# Patient Record
Sex: Male | Born: 1985 | Hispanic: No | Marital: Married | Smoking: Former smoker
Health system: Southern US, Community
[De-identification: ages and names within clinical notes are randomized; demographics above are authoritative.]

## PROBLEM LIST (undated history)

## (undated) DIAGNOSIS — L932 Other local lupus erythematosus: Secondary | ICD-10-CM

## (undated) DIAGNOSIS — J45909 Unspecified asthma, uncomplicated: Secondary | ICD-10-CM

## (undated) DIAGNOSIS — E785 Hyperlipidemia, unspecified: Secondary | ICD-10-CM

## (undated) DIAGNOSIS — E039 Hypothyroidism, unspecified: Secondary | ICD-10-CM

## (undated) HISTORY — DX: Hyperlipidemia, unspecified: E78.5

## (undated) HISTORY — DX: Other local lupus erythematosus: L93.2

## (undated) HISTORY — DX: Unspecified asthma, uncomplicated: J45.909

## (undated) HISTORY — DX: Hypothyroidism, unspecified: E03.9

---

## 2015-04-02 ENCOUNTER — Encounter: Payer: Self-pay | Admitting: Family Medicine

## 2015-04-02 ENCOUNTER — Ambulatory Visit (INDEPENDENT_AMBULATORY_CARE_PROVIDER_SITE_OTHER): Payer: BLUE CROSS/BLUE SHIELD | Admitting: Family Medicine

## 2015-04-02 VITALS — BP 131/85 | HR 86 | Ht 69.0 in | Wt 181.0 lb

## 2015-04-02 DIAGNOSIS — IMO0001 Reserved for inherently not codable concepts without codable children: Secondary | ICD-10-CM | POA: Insufficient documentation

## 2015-04-02 DIAGNOSIS — E039 Hypothyroidism, unspecified: Secondary | ICD-10-CM | POA: Insufficient documentation

## 2015-04-02 DIAGNOSIS — J4520 Mild intermittent asthma, uncomplicated: Secondary | ICD-10-CM | POA: Diagnosis not present

## 2015-04-02 DIAGNOSIS — E038 Other specified hypothyroidism: Secondary | ICD-10-CM | POA: Diagnosis not present

## 2015-04-02 DIAGNOSIS — J45909 Unspecified asthma, uncomplicated: Secondary | ICD-10-CM | POA: Insufficient documentation

## 2015-04-02 DIAGNOSIS — Z0000 Encounter for general adult medical examination without abnormal findings: Secondary | ICD-10-CM | POA: Diagnosis not present

## 2015-04-02 HISTORY — DX: Hypothyroidism, unspecified: E03.9

## 2015-04-02 HISTORY — DX: Unspecified asthma, uncomplicated: J45.909

## 2015-04-02 MED ORDER — ALBUTEROL SULFATE HFA 108 (90 BASE) MCG/ACT IN AERS: 2.0000 | INHALATION_SPRAY | Freq: Four times a day (QID) | RESPIRATORY_TRACT | Status: DC | PRN

## 2015-04-02 MED ORDER — LEVOTHYROXINE SODIUM 100 MCG PO TABS: 100.0000 ug | ORAL_TABLET | Freq: Every day | ORAL | Status: DC

## 2015-04-02 NOTE — Progress Notes (Signed)
Austin Miranda is a 29 y.o. male who presents to [facility]  today for establish care and wellness visit. Patient has no active medical problems. He has hypothyroidism and mild asthma. These are well controlled with levothyroxine and intermittent albuterol. He uses as albuterol inhaler a few times a week. This is been extensively worked up with a pulmonologist in the past and this is the best option for him. He denies any chest pains or palpitations shortness of breath. He denies any feeling too hot or feeling to call. He feels well otherwise. He is not currently fasting.   History reviewed. No pertinent past medical history. History reviewed. No pertinent past surgical history. Social History  Substance Use Topics  . Smoking status: Former Games developer  . Smokeless tobacco: Not on file  . Alcohol Use: No   ROS as above Medications: Current Outpatient Prescriptions  Medication Sig Dispense Refill  . albuterol (VENTOLIN HFA) 108 (90 BASE) MCG/ACT inhaler Inhale 2 puffs into the lungs every 6 (six) hours as needed. 1 Inhaler 2  . levothyroxine (SYNTHROID, LEVOTHROID) 100 MCG tablet Take 1 tablet (100 mcg total) by mouth daily before breakfast. 90 tablet 2   No current facility-administered medications for this visit.   No Known Allergies   Exam:  BP 131/85 mmHg  Pulse 86  Ht  (1.753 m)  Wt 181 lb (82.101 kg)  BMI 26.72 kg/m2 Gen: Well NAD HEENT: EOMI,  MMM Lungs: Normal work of breathing. CTABL Heart: RRR no MRG Abd: NABS, Soft. Nondistended, Nontender Exts: Brisk capillary refill, warm and well perfused.   No results found for this or any previous visit (from the past 24 hour(s)). No results found.   Please see individual assessment and plan sections.

## 2015-04-02 NOTE — Patient Instructions (Signed)
Thank you for coming in today. Return for fasting labs.  I will call with results.  Call or go to the emergency room if you get worse, have trouble breathing, have chest pains, or palpitations.

## 2015-04-02 NOTE — Assessment & Plan Note (Signed)
Doing well. Screening fasting labs last few days.

## 2015-04-02 NOTE — Assessment & Plan Note (Signed)
Refill albuterol. Continue to follow.

## 2015-04-02 NOTE — Assessment & Plan Note (Addendum)
Refill levothyroxine. Check TSH

## 2015-04-03 LAB — CBC
HEMATOCRIT: 45.9 % (ref 39.0–52.0)
HEMOGLOBIN: 15.9 g/dL (ref 13.0–17.0)
MCH: 29.6 pg (ref 26.0–34.0)
MCHC: 34.6 g/dL (ref 30.0–36.0)
MCV: 85.3 fL (ref 78.0–100.0)
MPV: 10.2 fL (ref 8.6–12.4)
Platelets: 308 10*3/uL (ref 150–400)
RBC: 5.38 MIL/uL (ref 4.22–5.81)
RDW: 14.6 % (ref 11.5–15.5)
WBC: 5.2 10*3/uL (ref 4.0–10.5)

## 2015-04-03 LAB — LIPID PANEL
Cholesterol: 206 mg/dL — ABNORMAL HIGH (ref 125–200)
HDL: 19 mg/dL — ABNORMAL LOW (ref >=40)
LDL CALC: 122 mg/dL (ref <130)
Total CHOL/HDL Ratio: 10.8 Ratio — ABNORMAL HIGH (ref <=5.0)
Triglycerides: 324 mg/dL — ABNORMAL HIGH (ref <150)
VLDL: 65 mg/dL — AB (ref <30)

## 2015-04-03 LAB — COMPREHENSIVE METABOLIC PANEL
ALK PHOS: 62 U/L (ref 40–115)
ALT: 80 U/L — AB (ref 9–46)
AST: 33 U/L (ref 10–40)
Albumin: 4.4 g/dL (ref 3.6–5.1)
BUN: 9 mg/dL (ref 7–25)
CO2: 25 mmol/L (ref 20–31)
CREATININE: 0.91 mg/dL (ref 0.60–1.35)
Calcium: 9.4 mg/dL (ref 8.6–10.3)
Chloride: 103 mmol/L (ref 98–110)
GLUCOSE: 97 mg/dL (ref 65–99)
Potassium: 4.7 mmol/L (ref 3.5–5.3)
SODIUM: 138 mmol/L (ref 135–146)
TOTAL PROTEIN: 6.8 g/dL (ref 6.1–8.1)
Total Bilirubin: 0.8 mg/dL (ref 0.2–1.2)

## 2015-04-03 LAB — TSH: TSH: 8.594 u[IU]/mL — AB (ref 0.350–4.500)

## 2015-04-03 LAB — T4, FREE: FREE T4: 1.06 ng/dL (ref 0.80–1.80)

## 2015-04-04 MED ORDER — ATORVASTATIN CALCIUM 40 MG PO TABS: 40.0000 mg | ORAL_TABLET | Freq: Every day | ORAL | Status: DC

## 2015-04-04 NOTE — Addendum Note (Signed)
Addended by: Rodolph Bong on: 04/04/2015 01:16 PM   Modules accepted: Orders

## 2015-04-04 NOTE — Progress Notes (Signed)
Quick Note:  Lipid panel really bad. I send in lipitor. Return for recheck in 1 month. ______

## 2015-04-13 ENCOUNTER — Encounter: Payer: Self-pay | Admitting: Family Medicine

## 2015-04-13 DIAGNOSIS — E038 Other specified hypothyroidism: Secondary | ICD-10-CM

## 2015-04-13 DIAGNOSIS — J4520 Mild intermittent asthma, uncomplicated: Secondary | ICD-10-CM

## 2015-04-14 MED ORDER — ATORVASTATIN CALCIUM 40 MG PO TABS: 40.0000 mg | ORAL_TABLET | Freq: Every day | ORAL | Status: DC

## 2015-04-14 MED ORDER — LEVOTHYROXINE SODIUM 100 MCG PO TABS: 100.0000 ug | ORAL_TABLET | Freq: Every day | ORAL | Status: DC

## 2015-04-14 MED ORDER — ALBUTEROL SULFATE HFA 108 (90 BASE) MCG/ACT IN AERS: 2.0000 | INHALATION_SPRAY | Freq: Four times a day (QID) | RESPIRATORY_TRACT | Status: DC | PRN

## 2015-04-14 NOTE — Telephone Encounter (Signed)
[facility]

## 2015-10-20 ENCOUNTER — Encounter: Payer: Self-pay | Admitting: Family Medicine

## 2015-10-20 ENCOUNTER — Ambulatory Visit (INDEPENDENT_AMBULATORY_CARE_PROVIDER_SITE_OTHER): Payer: BLUE CROSS/BLUE SHIELD | Admitting: Family Medicine

## 2015-10-20 VITALS — BP 122/79 | HR 89 | Wt 175.0 lb

## 2015-10-20 DIAGNOSIS — E038 Other specified hypothyroidism: Secondary | ICD-10-CM

## 2015-10-20 DIAGNOSIS — R42 Dizziness and giddiness: Secondary | ICD-10-CM

## 2015-10-20 NOTE — Progress Notes (Signed)
       Austin CarbonDeepak Miranda is a 30 y.o. male who [facility] today for lightheaded and dizziness. Patient was in normal state of health when he became somewhat lightheaded and dizzy today. He denies any vertigo. He was standing when his symptoms started. He felt somewhat better when he sat down. He denies any palpitations chest pain or shortness of breath. He denies any changes in medications or recent exertion or loss of sweating. He denies any change in hearing or tinnitus.   No past medical history on file. No past surgical history on file. Social History  Substance Use Topics  . Smoking status: Former Games developermoker  . Smokeless tobacco: Not on file  . Alcohol Use: No   family history includes Diabetes in his mother; Hypertension in his mother.  ROS as above Medications: Current Outpatient Prescriptions  Medication Sig Dispense Refill  . albuterol (VENTOLIN HFA) 108 (90 BASE) MCG/ACT inhaler Inhale 2 puffs into the lungs every 6 (six) hours as needed. 1 Inhaler 2  . levothyroxine (SYNTHROID, LEVOTHROID) 100 MCG tablet Take 1 tablet (100 mcg total) by mouth daily before breakfast. 90 tablet 2   No current facility-administered medications for this visit.   No Known Allergies   Exam:  BP 122/79 mmHg  Pulse 89  Wt 175 lb (79.379 kg)   Orthostatic VS for the past 24 hrs:  BP- Lying Pulse- Lying BP- Sitting Pulse- Sitting BP- Standing at 0 minutes Pulse- Standing at 0 minutes  10/20/15 1348 129/74 mmHg 78 117/78 mmHg 87 127/83 mmHg 106      Gen: Well NAD HEENT: EOMI,  MMM normal tympanic membranes bilaterally. PERRL bilaterally. Lungs: Normal work of breathing. CTABL Heart: RRR no MRG Abd: NABS, Soft. Nondistended, Nontender Exts: Brisk capillary refill, warm and well perfused.  Neuro: Alert and oriented normal coordination balance gait strength and sensation. Negative  Dix-Hallpike test bilaterally.  No results found for this or any previous visit (from the past 24 hour(s)). No results found.   Please see individual assessment and plan sections.

## 2015-10-20 NOTE — Patient Instructions (Signed)
Thank you for coming in today. Drink plenty of fluids and eat something salty.  Return if not better.  Call or go to the emergency room if you get worse, have trouble breathing, have chest pains, or palpitations.   Orthostatic Hypotension Orthostatic hypotension is a sudden drop in blood pressure. It happens when you quickly stand up from a seated or lying position. You may feel dizzy or light-headed. This can last for just a few seconds or for up to a few minutes. It is usually not a serious problem. However, if this happens frequently or gets worse, it can be a sign of something more serious. CAUSES  Different things can cause orthostatic hypotension, including:   Loss of body fluids (dehydration).  Medicines that lower blood pressure.  Sudden changes in posture, such as standing up quickly after you have been sitting or lying down.  Taking too much of your medicine. SIGNS AND SYMPTOMS   Light-headedness or dizziness.   Fainting or near-fainting.   A fast heart rate.   Weakness.   Feeling tired (fatigue).  DIAGNOSIS  Your health care provider may do several things to help diagnose your condition and identify the cause. These may include:   Taking a medical history and doing a physical exam.  Checking your blood pressure. Your health care provider will check your blood pressure when you are:  Lying down.  Sitting.  Standing.  Using tilt table testing. In this test, you lie down on a table that moves from a lying position to a standing position. You will be strapped onto the table. This test monitors your blood pressure and heart rate when you are in different positions. TREATMENT  Treatment will vary depending on the cause. Possible treatments include:   Changing the dosage of your medicines.  Wearing compression stockings on your lower legs.  Standing up slowly after sitting or lying down.  Eating more salt.  Eating frequent, small meals.  In some cases,  getting IV fluids.  Taking medicine to enhance fluid retention. HOME CARE INSTRUCTIONS  Only take over-the-counter or prescription medicines as directed by your health care provider.  Follow your health care provider's instructions for changing the dosage of your current medicines.  Do not stop or adjust your medicine on your own.  Stand up slowly after sitting or lying down. This allows your body to adjust to the different position.  Wear compression stockings as directed.  Eat extra salt as directed.  Do not add extra salt to your diet unless directed to by your health care provider.  Eat frequent, small meals.  Avoid standing suddenly after eating.  Avoid hot showers or excessive heat as directed by your health care provider.  Keep all follow-up appointments. SEEK MEDICAL CARE IF:  You continue to feel dizzy or light-headed after standing.  You feel groggy or confused.  You feel cold, clammy, or sick to your stomach (nauseous).  You have blurred vision.  You feel short of breath. SEEK IMMEDIATE MEDICAL CARE IF:   You faint after standing.  You have chest pain.  You have difficulty breathing.   You lose feeling or movement in your arms or legs.   You have slurred speech or difficulty talking, or you are unable to talk.  MAKE SURE YOU:   Understand these instructions.  Will watch your condition.  Will get help right away if you are not doing well or get worse.   This information is not intended to replace advice given  to you by your health care provider. Make sure you discuss any questions you have with your health care provider.   Document Released: 07/23/2002 Document Revised: 08/07/2013 Document Reviewed: 05/25/2013 Elsevier Interactive Patient Education [employer].

## 2015-10-20 NOTE — Assessment & Plan Note (Signed)
Dizziness likely due to mild orthostasis. Recommend oral fluid rehydration. Check CBC CMP and thyroid labs. Return in a few days if not better.

## 2015-11-04 LAB — CBC
HEMATOCRIT: 48.9 % (ref 39.0–52.0)
HEMOGLOBIN: 16.6 g/dL (ref 13.0–17.0)
MCH: 29.2 pg (ref 26.0–34.0)
MCHC: 33.9 g/dL (ref 30.0–36.0)
MCV: 86.1 fL (ref 78.0–100.0)
MPV: 10.1 fL (ref 8.6–12.4)
PLATELETS: 324 10*3/uL (ref 150–400)
RBC: 5.68 MIL/uL (ref 4.22–5.81)
RDW: 14.3 % (ref 11.5–15.5)
WBC: 4.9 10*3/uL (ref 4.0–10.5)

## 2015-11-05 LAB — COMPREHENSIVE METABOLIC PANEL
ALBUMIN: 4.5 g/dL (ref 3.6–5.1)
ALK PHOS: 61 U/L (ref 40–115)
ALT: 45 U/L (ref 9–46)
AST: 25 U/L (ref 10–40)
BUN: 13 mg/dL (ref 7–25)
CALCIUM: 9.4 mg/dL (ref 8.6–10.3)
CHLORIDE: 101 mmol/L (ref 98–110)
CO2: 25 mmol/L (ref 20–31)
Creat: 0.82 mg/dL (ref 0.60–1.35)
Glucose, Bld: 91 mg/dL (ref 65–99)
POTASSIUM: 4.5 mmol/L (ref 3.5–5.3)
Sodium: 138 mmol/L (ref 135–146)
TOTAL PROTEIN: 6.9 g/dL (ref 6.1–8.1)
Total Bilirubin: 0.5 mg/dL (ref 0.2–1.2)

## 2015-11-05 LAB — TSH: TSH: 5.3 m[IU]/L — AB (ref 0.40–4.50)

## 2015-11-05 LAB — T4, FREE: FREE T4: 1.3 ng/dL (ref 0.8–1.8)

## 2015-11-05 LAB — T3, FREE: T3, Free: 2.7 pg/mL (ref 2.3–4.2)

## 2015-11-06 MED ORDER — LEVOTHYROXINE SODIUM 112 MCG PO TABS: 112.0000 ug | ORAL_TABLET | Freq: Every day | ORAL | Status: DC

## 2015-11-06 NOTE — Progress Notes (Signed)
Quick Note:  The thyroid level seems like were probably not giving you enough thyroid medicine. I increased the dose to 112 micrograms daily. You should receive the new dose at the mail-order pharmacy in the next few days. We should recheck the thyroid level in about 3 months.  Labs are normal otherwise. ______

## 2015-11-06 NOTE — Addendum Note (Signed)
Addended by: Rodolph BongOREY, Toi Stelly S on: 11/06/2015 01:45 PM   Modules accepted: Orders

## 2016-03-17 ENCOUNTER — Ambulatory Visit (INDEPENDENT_AMBULATORY_CARE_PROVIDER_SITE_OTHER): Payer: BLUE CROSS/BLUE SHIELD | Admitting: Family Medicine

## 2016-03-17 ENCOUNTER — Encounter: Payer: Self-pay | Admitting: Family Medicine

## 2016-03-17 VITALS — BP 137/82 | HR 85 | Wt 174.0 lb

## 2016-03-17 DIAGNOSIS — Z0000 Encounter for general adult medical examination without abnormal findings: Secondary | ICD-10-CM | POA: Diagnosis not present

## 2016-03-17 DIAGNOSIS — E038 Other specified hypothyroidism: Secondary | ICD-10-CM

## 2016-03-17 DIAGNOSIS — IMO0001 Reserved for inherently not codable concepts without codable children: Secondary | ICD-10-CM

## 2016-03-17 DIAGNOSIS — E785 Hyperlipidemia, unspecified: Secondary | ICD-10-CM | POA: Insufficient documentation

## 2016-03-17 DIAGNOSIS — Z23 Encounter for immunization: Secondary | ICD-10-CM

## 2016-03-17 HISTORY — DX: Hyperlipidemia, unspecified: E78.5

## 2016-03-17 NOTE — Patient Instructions (Signed)
Thank you for coming in today. Get fasting labs soon.  Return in 6 months for thyroid recheck.

## 2016-03-17 NOTE — Progress Notes (Signed)
       Fedrick Southward is a 30 y.o. male who presents to [facility]: Primary Care Sports Medicine today for well visit. Patient is doing well today with no complaints. He is a past medical history significant for hypothyroidism. His levothyroxine dose was adjusted a few months ago. Is asymptomatic with no complaints. No fevers chills nausea vomiting or diarrhea. He exercises daily. He does not smoke.   No past medical history on file. No past surgical history on file. Social History  Substance Use Topics  . Smoking status: Former Games developer  . Smokeless tobacco: Not on file  . Alcohol use No   family history includes Diabetes in his mother; Hypertension in his mother.  ROS as above: No headache, visual changes, nausea, vomiting, diarrhea, constipation, dizziness, abdominal pain, skin rash, fevers, chills, night sweats, weight loss, swollen lymph nodes, body aches, joint swelling, muscle aches, chest pain, shortness of breath, mood changes, visual or auditory hallucinations.    Medications: Current Outpatient Prescriptions  Medication Sig Dispense Refill  . albuterol (VENTOLIN HFA) 108 (90 BASE) MCG/ACT inhaler Inhale 2 puffs into the lungs every 6 (six) hours as needed. 1 Inhaler 2  . levothyroxine (SYNTHROID, LEVOTHROID) 112 MCG tablet Take 1 tablet (112 mcg total) by mouth daily. 90 tablet 3   No current facility-administered medications for this visit.    No Known Allergies   Exam:  BP 137/82   Pulse 85   Wt 174 lb (78.9 kg)   BMI 25.70 kg/m  Gen: Well NAD HEENT: EOMI,  MMM Lungs: Normal work of breathing. CTABL Heart: RRR no MRG Abd: NABS, Soft. Nondistended, Nontender Exts: Brisk capillary refill, warm and well perfused.   No results found for this or any previous visit (from the past 24 hour(s)). No results found.    Assessment and Plan: 30 y.o. male with Well adult. Plan to recheck  TSH as well as repeating lipid panel.  Tdap given prior to discharge. Healthiness up-to-date. Return in 6 months for recheck TSH if all is well.   Orders Placed This Encounter  Procedures  . Tdap vaccine greater than or equal to 7yo IM  . Lipid panel    Order Specific Question:   Has the patient fasted?    Answer:   No  . T4, free  . T3, free  . TSH  . Comprehensive metabolic panel    Order Specific Question:   Has the patient fasted?    Answer:   No    Discussed warning signs or symptoms. Please see discharge instructions. Patient expresses understanding.

## 2016-03-18 LAB — COMPREHENSIVE METABOLIC PANEL
ALBUMIN: 4.5 g/dL (ref 3.6–5.1)
ALK PHOS: 60 U/L (ref 40–115)
ALT: 37 U/L (ref 9–46)
AST: 23 U/L (ref 10–40)
BILIRUBIN TOTAL: 0.5 mg/dL (ref 0.2–1.2)
BUN: 9 mg/dL (ref 7–25)
CALCIUM: 9.7 mg/dL (ref 8.6–10.3)
CO2: 23 mmol/L (ref 20–31)
Chloride: 105 mmol/L (ref 98–110)
Creat: 0.93 mg/dL (ref 0.60–1.35)
GLUCOSE: 90 mg/dL (ref 65–99)
Potassium: 4.5 mmol/L (ref 3.5–5.3)
Sodium: 140 mmol/L (ref 135–146)
Total Protein: 6.8 g/dL (ref 6.1–8.1)

## 2016-03-18 LAB — LIPID PANEL
CHOL/HDL RATIO: 7.3 ratio — AB (ref <=5.0)
CHOLESTEROL: 196 mg/dL (ref 125–200)
HDL: 27 mg/dL — AB (ref >=40)
LDL Cholesterol: 129 mg/dL (ref <130)
Triglycerides: 198 mg/dL — ABNORMAL HIGH (ref <150)
VLDL: 40 mg/dL — ABNORMAL HIGH (ref <30)

## 2016-03-18 LAB — T3, FREE: T3, Free: 2.9 pg/mL (ref 2.3–4.2)

## 2016-03-18 LAB — T4, FREE: FREE T4: 1.1 ng/dL (ref 0.8–1.8)

## 2016-03-18 LAB — TSH: TSH: 3.9 mIU/L (ref 0.40–4.50)

## 2016-03-19 MED ORDER — LEVOTHYROXINE SODIUM 112 MCG PO TABS: 112.0000 ug | ORAL_TABLET | Freq: Every day | ORAL | 3 refills | Status: DC

## 2016-03-19 NOTE — Addendum Note (Signed)
Addended by: Rodolph Bong on: 03/19/2016 07:21 AM   Modules accepted: Orders

## 2016-04-27 ENCOUNTER — Other Ambulatory Visit: Payer: Self-pay | Admitting: Family Medicine

## 2016-04-27 DIAGNOSIS — J4520 Mild intermittent asthma, uncomplicated: Secondary | ICD-10-CM

## 2016-07-19 ENCOUNTER — Encounter: Payer: Self-pay | Admitting: Family Medicine

## 2016-07-20 MED ORDER — LEVOTHYROXINE SODIUM 112 MCG PO TABS: 112.0000 ug | ORAL_TABLET | Freq: Every day | ORAL | 1 refills | Status: DC

## 2016-07-20 MED ORDER — ALBUTEROL SULFATE HFA 108 (90 BASE) MCG/ACT IN AERS: 2.0000 | INHALATION_SPRAY | Freq: Four times a day (QID) | RESPIRATORY_TRACT | 11 refills | Status: DC | PRN

## 2016-12-16 ENCOUNTER — Ambulatory Visit (INDEPENDENT_AMBULATORY_CARE_PROVIDER_SITE_OTHER): Payer: BLUE CROSS/BLUE SHIELD | Admitting: Osteopathic Medicine

## 2016-12-16 ENCOUNTER — Other Ambulatory Visit: Payer: Self-pay

## 2016-12-16 ENCOUNTER — Encounter: Payer: Self-pay | Admitting: Osteopathic Medicine

## 2016-12-16 ENCOUNTER — Telehealth: Payer: Self-pay

## 2016-12-16 VITALS — BP 130/89 | HR 74 | Ht 69.0 in | Wt 164.0 lb

## 2016-12-16 DIAGNOSIS — R109 Unspecified abdominal pain: Secondary | ICD-10-CM

## 2016-12-16 DIAGNOSIS — E038 Other specified hypothyroidism: Secondary | ICD-10-CM | POA: Diagnosis not present

## 2016-12-16 DIAGNOSIS — K5900 Constipation, unspecified: Secondary | ICD-10-CM

## 2016-12-16 MED ORDER — DICYCLOMINE HCL 20 MG PO TABS: 20.0000 mg | ORAL_TABLET | Freq: Three times a day (TID) | ORAL | 1 refills | Status: DC | PRN

## 2016-12-16 NOTE — Telephone Encounter (Signed)
Okay, medication routed to local pharmacy. [facility] and [facility] old order

## 2016-12-16 NOTE — Addendum Note (Signed)
Addended by: Deirdre PippinsALEXANDER, Aretha Levi M on: 12/16/2016 03:46 PM   Modules accepted: Orders

## 2016-12-16 NOTE — Telephone Encounter (Signed)
PT [place] [facility] [address] [place]. The pharmacy listed on AVS is incorrect.

## 2016-12-16 NOTE — Progress Notes (Signed)
HPI: Austin Miranda is a 31 y.o. male  who presents to [facility] today, 12/16/16,  for chief complaint of:  Chief Complaint  Patient presents with  . Abdominal Pain    Abdominal cramping, constipation . Context:No recent travel, no unusual food exposure.  . Location: Epigastric, right lower quadrant, but overall is migrating . Quality: Cramping pain . Duration: Several days at this point . Timing: cramping pain throughout abdomen typically worse with eating . Modifying factors: Recently seen in urgent care in [place], x-ray was done which showed constipation, no other records available. He was given MiraLAX for constipation which has helped somewhat . Assoc signs/symptoms: No heartburn symptoms, no chest pain or radiating pain to the back or shoulders. Typically patient has normal bowel movements but notices has been lower volume lately, no blood in the stool. Treatment as above for constipation "clear things out" but pain was not significantly improved. Occasional nausea but no vomiting.     Past medical history, surgical history, social history and family history reviewed.  Patient Active Problem List   Diagnosis Date Noted  . Dyslipidemia 03/17/2016  . Hypothyroidism 04/02/2015  . Asthma, chronic 04/02/2015    Current medication list and allergy/intolerance information reviewed.   Current Outpatient Prescriptions on File Prior to Visit  Medication Sig Dispense Refill  . albuterol (PROVENTIL HFA;VENTOLIN HFA) 108 (90 Base) MCG/ACT inhaler Inhale 2 puffs into the lungs every 6 (six) hours as needed for wheezing. 2 Inhaler 11  . levothyroxine (SYNTHROID, LEVOTHROID) 112 MCG tablet Take 1 tablet (112 mcg total) by mouth daily. 90 tablet 1   No current facility-administered medications on file prior to visit.    No Known Allergies    Review of Systems:  Constitutional: No recent illness, Feels well today except her GI complaints as noted per  history of present illness  Cardiac: No  chest pain, No  pressure, No palpitations  Respiratory:  No  shortness of breath. No  Cough  Gastrointestinal: +abdominal pain, +change on bowel habits  Musculoskeletal: No new myalgia/arthralgia  Neurologic: No  weakness, No  Dizziness  Psychiatric: No  concerns with depression, No  concerns with anxiety  Exam:  BP 130/89   Pulse 74   Ht 5\' 9"  (1.753 m)   Wt 164 lb (74.4 kg)   BMI 24.22 kg/m   Constitutional: VS see above. General Appearance: alert, well-developed, well-nourished, NAD  Eyes: Normal lids and conjunctive, non-icteric sclera  Ears, Nose, Mouth, Throat: MMM, Normal external inspection ears/nares/mouth/lips/gums.  Neck: No masses, trachea midline.   Respiratory: Normal respiratory effort. no wheeze, no rhonchi, no rales  Cardiovascular: S1/S2 normal, no murmur, no rub/gallop auscultated. RRR.   Musculoskeletal: Gait normal. Symmetric and independent movement of all extremities  Abdominal: Bowel sounds normal in all 4 quadrants. Nondistended. No guarding/rebound. Negative Murphy/McBurney's but some tenderness to palpation in epigastrium and right lower quadrant, percussion alternating between dull versus tympanic throughout abdomen  Neurological: Normal balance/coordination. No tremor.  Skin: warm, dry, intact.   Psychiatric: Normal judgment/insight. Normal mood and affect. Oriented x3.    ASSESSMENT/PLAN: Migrating crampy pain is a bit concerning for possible constipation, where that might be I am not sure but worth checking labs including thyroid as below. No previous episodes of this to cause great concern for irritable bowel though this is in the differential. Would consider CT versus ultrasound based on how symptoms progress with treatment & based on lab results. At this point no clinical suspicion for  cholecystitis or appendicitis but would certainly require evaluation if pain becomes worse, fever/chills, elevated  white blood count - patient has been instructed on ER/RTC precautions.  Abdominal cramping - Plan: CBC with Differential/Platelet, COMPLETE METABOLIC PANEL WITH GFR, Lipid panel, TSH, Lipase, DISCONTINUED: dicyclomine (BENTYL) 20 MG tablet  Other specified hypothyroidism  Constipation, unspecified constipation type    Patient Instructions  Plan: 1. Labs today 2. Medications to help symptoms 3. If labs show any concerns, we will get scan (ultrasound or CT scan) 4. Continue to treat constipation - can use Miralax as directed, can add fiber or Senna over the counter treatments.  5. Will know more based on results and on how you're feeling over th enext few days - if worse (especially if you develop severe pain, nausea, fever, blood in stool, vomiting anything that looks like coffee grounds), please seek emergency medical care  6. If symptoms persist and we can't find a reason, we will send to specialist     Follow-up plan: Return for recheck based on labs/symptoms .  Visit summary with medication list and pertinent instructions was printed for patient to review, alert us if any changes needed. All questions at time of visit were answered - patient instructed to contact office with any additional concerns. ER/RTC precautions were reviewed with the patient and understanding verbalized.

## 2016-12-16 NOTE — Patient Instructions (Addendum)
Plan: 1. Labs today 2. Medications to help symptoms 3. If labs show any concerns, we will get scan (ultrasound or CT scan) 4. Continue to treat constipation - can use Miralax as directed, can add fiber or Senna over the counter treatments.  5. Will know more based on results and on how you're feeling over th enext few days - if worse (especially if you develop severe pain, nausea, fever, blood in stool, vomiting anything that looks like coffee grounds), please seek emergency medical care  6. If symptoms persist and we can't find a reason, we will send to specialist

## 2016-12-17 LAB — COMPLETE METABOLIC PANEL WITH GFR
ALT: 38 U/L (ref 9–46)
AST: 24 U/L (ref 10–40)
Albumin: 4.6 g/dL (ref 3.6–5.1)
Alkaline Phosphatase: 60 U/L (ref 40–115)
BUN: 10 mg/dL (ref 7–25)
CHLORIDE: 102 mmol/L (ref 98–110)
CO2: 25 mmol/L (ref 20–31)
CREATININE: 1.07 mg/dL (ref 0.60–1.35)
Calcium: 9.9 mg/dL (ref 8.6–10.3)
GFR, Est African American: >89 mL/min (ref >=60)
GFR, Est Non African American: >89 mL/min (ref >=60)
GLUCOSE: 81 mg/dL (ref 65–99)
Potassium: 4.9 mmol/L (ref 3.5–5.3)
SODIUM: 138 mmol/L (ref 135–146)
Total Bilirubin: 0.6 mg/dL (ref 0.2–1.2)
Total Protein: 7.4 g/dL (ref 6.1–8.1)

## 2016-12-17 LAB — CBC WITH DIFFERENTIAL/PLATELET
Basophils Absolute: 55 cells/uL (ref 0–200)
Basophils Relative: 1 %
Eosinophils Absolute: 935 cells/uL — ABNORMAL HIGH (ref 15–500)
Eosinophils Relative: 17 %
HEMATOCRIT: 49.8 % (ref 38.5–50.0)
Hemoglobin: 16.6 g/dL (ref 13.2–17.1)
LYMPHS PCT: 33 %
Lymphs Abs: 1815 cells/uL (ref 850–3900)
MCH: 29.1 pg (ref 27.0–33.0)
MCHC: 33.3 g/dL (ref 32.0–36.0)
MCV: 87.4 fL (ref 80.0–100.0)
MONO ABS: 495 {cells}/uL (ref 200–950)
MPV: 10.1 fL (ref 7.5–12.5)
Monocytes Relative: 9 %
Neutro Abs: 2200 cells/uL (ref 1500–7800)
Neutrophils Relative %: 40 %
PLATELETS: 322 10*3/uL (ref 140–400)
RBC: 5.7 MIL/uL (ref 4.20–5.80)
RDW: 14.3 % (ref 11.0–15.0)
WBC: 5.5 10*3/uL (ref 3.8–10.8)

## 2016-12-17 LAB — LIPASE: Lipase: 17 U/L (ref 7–60)

## 2016-12-17 LAB — LIPID PANEL
Cholesterol: 224 mg/dL — ABNORMAL HIGH (ref <200)
HDL: 29 mg/dL — ABNORMAL LOW (ref >40)
LDL CALC: 156 mg/dL — AB (ref <100)
Total CHOL/HDL Ratio: 7.7 Ratio — ABNORMAL HIGH (ref <5.0)
Triglycerides: 195 mg/dL — ABNORMAL HIGH (ref <150)
VLDL: 39 mg/dL — AB (ref <30)

## 2016-12-17 LAB — TSH: TSH: 3.57 mIU/L (ref 0.40–4.50)

## 2016-12-19 ENCOUNTER — Encounter: Payer: Self-pay | Admitting: Osteopathic Medicine

## 2016-12-31 ENCOUNTER — Encounter: Payer: Self-pay | Admitting: Family Medicine

## 2017-01-03 ENCOUNTER — Ambulatory Visit (INDEPENDENT_AMBULATORY_CARE_PROVIDER_SITE_OTHER): Payer: BLUE CROSS/BLUE SHIELD | Admitting: Family Medicine

## 2017-01-03 ENCOUNTER — Encounter: Payer: Self-pay | Admitting: Family Medicine

## 2017-01-03 DIAGNOSIS — R109 Unspecified abdominal pain: Secondary | ICD-10-CM | POA: Insufficient documentation

## 2017-01-03 DIAGNOSIS — R1030 Lower abdominal pain, unspecified: Secondary | ICD-10-CM

## 2017-01-03 MED ORDER — ELUXADOLINE 100 MG PO TABS: 100.0000 mg | ORAL_TABLET | Freq: Two times a day (BID) | ORAL | 2 refills | Status: DC

## 2017-01-03 NOTE — Progress Notes (Signed)
Austin Miranda is a 31 y.o. male who [facility] [facility] today for abdominal pain. Patient has been dealing with abdominal pain off and on for several weeks. He notes cramping lower abdominal pain associated with intermittent diarrhea and constipation. He denies any vomiting or blood in stool. He had a limited workup in this clinic in early May as well as the gastroenterology office in [place] a few days ago. He had a normal EGD as well as abdominal ultrasound. He's been trying to take dicyclomine which does not help much at all. He denies fevers or chills. He's been losing weight because he has trouble tolerating most foods. He can't think of any exposures that may be causing his pain. Nobody else has similar symptoms in his household.   Past Medical History:  Diagnosis Date  . Asthma, chronic 04/02/2015  . Dyslipidemia 03/17/2016  . Hypothyroidism 04/02/2015   No past surgical history on file. Social History  Substance Use Topics  . Smoking status: Former Games developermoker  . Smokeless tobacco: Never Used  . Alcohol use No   family history includes Diabetes in his mother; Hypertension in his mother.  ROS as above:  Medications: Current Outpatient Prescriptions  Medication Sig Dispense Refill  . albuterol (PROVENTIL HFA;VENTOLIN HFA) 108 (90 Base) MCG/ACT inhaler Inhale 2 puffs into the lungs every 6 (six) hours as needed for wheezing. 2 Inhaler 11  . dicyclomine (BENTYL) 20 MG tablet Take 1 tablet (20 mg total) by mouth 3 (three) times daily as needed for spasms (abdominal pain). 30 tablet 1  . levothyroxine (SYNTHROID, LEVOTHROID) 112 MCG tablet Take 1 tablet (112 mcg total) by mouth daily. 90 tablet 1  . Eluxadoline (VIBERZI) 100 MG TABS Take 1 tablet (100 mg total) by mouth 2 (two) times daily. 60 tablet 2   No current facility-administered medications for this visit.     No Known Allergies  Health Maintenance Health Maintenance  Topic Date Due  . HIV Screening  08/15/1999  . INFLUENZA VACCINE  03/16/2017  . TETANUS/TDAP  03/17/2026     Exam:  BP 126/86   Pulse 60   Wt 157 lb (71.2 kg)   BMI 23.18 kg/m   Wt Readings from Last 10 Encounters:  01/03/17 157 lb (71.2 kg)  12/16/16 164 lb (74.4 kg)  03/17/16 174 lb (78.9 kg)  10/20/15 175 lb (79.4 kg)  04/02/15 181 lb (82.1 kg)    Gen: Well NAD nontoxic appearing. HEENT: EOMI,  MMM Lungs: Normal work of breathing. CTABL Heart: RRR no MRG Abd: NABS, Soft. Nondistended, Nontender Exts: Brisk capillary refill, warm and well perfused.     Chemistry      Component Value Date/Time   NA 138 12/16/2016 1331   K 4.9 12/16/2016 1331   CL 102 12/16/2016 1331   CO2 25 12/16/2016 1331   BUN 10 12/16/2016 1331   CREATININE 1.07 12/16/2016 1331      Component Value Date/Time   CALCIUM 9.9 12/16/2016 1331   ALKPHOS 60 12/16/2016 1331   AST 24 12/16/2016 1331   ALT 38 12/16/2016 1331   BILITOT 0.6 12/16/2016 1331     Lab Results  Component Value Date   LIPASE 17 12/16/2016   Lab Results  Component Value Date   WBC 5.5 12/16/2016   HGB 16.6 12/16/2016   HCT 49.8 12/16/2016   MCV 87.4 12/16/2016   PLT 322 12/16/2016       Assessment  and Plan: 31 y.o. male with cramping abdominal pain associated with significant weight loss. Workup so far has been unrevealing. Plan for CT scan of the abdomen and pelvis. Likely will refer to gastrology after this test has been performed.  Incisions are somewhat consistent with IBS. I think is also reasonable to start the process for treating diarrhea dominant IBS with Viberzi.  Recheck no later than one month. We'll be checking back after CT scan done.   Orders Placed This Encounter  Procedures  . CT ABDOMEN PELVIS W CONTRAST    Standing Status:   Future    Standing Expiration Date:   04/05/2018    Order Specific Question:   If indicated for the  ordered procedure, I authorize the administration of contrast media per Radiology protocol    Answer:   Yes    Order Specific Question:   Reason for Exam (SYMPTOM  OR DIAGNOSIS REQUIRED)    Answer:   eval abdominal pain    Order Specific Question:   Preferred imaging location?    Answer:   [facility]    Order Specific Question:   Radiology Contrast Protocol - do NOT remove file path    Answer:   [email or web address]   Meds ordered this encounter  Medications  . Eluxadoline (VIBERZI) 100 MG TABS    Sig: Take 1 tablet (100 mg total) by mouth 2 (two) times daily.    Dispense:  60 tablet    Refill:  2     Discussed warning signs or symptoms. Please see discharge instructions. Patient expresses understanding.

## 2017-01-03 NOTE — Patient Instructions (Addendum)
Thank you for coming in today. We should get the scan done soon.  We will also try viberzi. Take it twice daily.  Recheck after 1 month.   Irritable Bowel Syndrome, Adult Irritable bowel syndrome (IBS) is not one specific disease. It is a group of symptoms that affects the organs responsible for digestion (gastrointestinal or GI tract). To regulate how your GI tract works, your body sends signals back and forth between your intestines and your brain. If you have IBS, there may be a problem with these signals. As a result, your GI tract does not function normally. Your intestines may become more sensitive and overreact to certain things. This is especially true when you eat certain foods or when you are under stress. There are four types of IBS. These may be determined based on the consistency of your stool:  IBS with diarrhea.  IBS with constipation.  Mixed IBS.  Unsubtyped IBS. It is important to know which type of IBS you have. Some treatments are more likely to be helpful for certain types of IBS. What are the causes? The exact cause of IBS is not known. What increases the risk? You may have a higher risk of IBS if:  You are a woman.  You are younger than 33 years old.  You have a family history of IBS.  You have mental health problems.  You have had bacterial infection of your GI tract. What are the signs or symptoms? Symptoms of IBS vary from person to person. The main symptom is abdominal pain or discomfort. Additional symptoms usually include one or more of the following:  Diarrhea, constipation, or both.  Abdominal swelling or bloating.  Feeling full or sick after eating a small or regular-size meal.  Frequent gas.  Mucus in the stool.  A feeling of having more stool left after a bowel movement. Symptoms tend to come and go. They may be associated with stress, psychiatric conditions, or nothing at all. How is this diagnosed? There is no specific test to  diagnose IBS. Your health care provider will make a diagnosis based on a physical exam, medical history, and your symptoms. You may have other tests to rule out other conditions that may be causing your symptoms. These may include:  Blood tests.  X-rays.  CT scan.  Endoscopy and colonoscopy. This is a test in which your GI tract is viewed with a long, thin, flexible tube. How is this treated? There is no cure for IBS, but treatment can help relieve symptoms. IBS treatment often includes:  Changes to your diet, such as:  Eating more fiber.  Avoiding foods that cause symptoms.  Drinking more water.  Eating regular, medium-sized portioned meals.  Medicines. These may include:  Fiber supplements if you have constipation.  Medicine to control diarrhea (antidiarrheal medicines).  Medicine to help control muscle spasms in your GI tract (antispasmodic medicines).  Medicines to help with any mental health issues, such as antidepressants or tranquilizers.  Therapy.  Talk therapy may help with anxiety, depression, or other mental health issues that can make IBS symptoms worse.  Stress reduction.  Managing your stress can help keep symptoms under control. Follow these instructions at home:  Take medicines only as directed by your health care provider.  Eat a healthy diet.  Avoid foods and drinks with added sugar.  Include more whole grains, fruits, and vegetables gradually into your diet. This may be especially helpful if you have IBS with constipation.  Avoid any foods and  drinks that make your symptoms worse. These may include dairy products and caffeinated or carbonated drinks.  Do not eat large meals.  Drink enough fluid to keep your urine clear or pale yellow.  Exercise regularly. Ask your health care provider for recommendations of good activities for you.  Keep all follow-up visits as directed by your health care provider. This is important. Contact a health care  provider if:  You have constant pain.  You have trouble or pain with swallowing.  You have worsening diarrhea. Get help right away if:  You have severe and worsening abdominal pain.  You have diarrhea and:  You have a rash, stiff neck, or severe headache.  You are irritable, sleepy, or difficult to awaken.  You are weak, dizzy, or extremely thirsty.  You have bright red blood in your stool or you have black tarry stools.  You have unusual abdominal swelling that is painful.  You vomit continuously.  You vomit blood (hematemesis).  You have both abdominal pain and a fever. This information is not intended to replace advice given to you by your health care provider. Make sure you discuss any questions you have with your health care provider. Document Released: 08/02/2005 Document Revised: 01/02/2016 Document Reviewed: 04/19/2014 Elsevier Interactive Patient Education  2017 Elsevier Inc.  Eluxadoline oral tablets What is this medicine? ELUXADOLINE (ee LUX ah dol ine) is an intestinal disorder drug. It is used to treat irritable bowel syndrome with diarrhea. This medicine may be used for other purposes; ask your health care provider or pharmacist if you have questions. COMMON BRAND NAME(S): Viberzi What should I tell my health care provider before I take this medicine? They need to know if you have any of these conditions: -drink more than 3 alcohol-containing drinks per day -gallbladder disease -have no gallbladder -history of constipation -history of drug or alcohol abuse problems -history of pancreatitis or pancreatic disease -liver disease -stomach or intestine problems -an unusual or allergic reaction to eluxadoline, other medicines, foods, dyes, or preservatives -pregnant or trying to get pregnant -breast-feeding How should I use this medicine? Take this medicine by mouth with a glass of water. Follow the directions on the prescription label. Take this medicine  with food. Take your medicine at regular intervals. Do not take it more often than directed. Do not stop taking except on your doctor's advice. Talk to your pediatrician regarding the use of this medicine in children. Special care may be needed. Overdosage: If you think you have taken too much of this medicine contact a poison control center or emergency room at once. NOTE: This medicine is only for you. Do not share this medicine with others. What if I miss a dose? If you miss a dose, take it as soon as you can. If it is almost time for your next dose, take only that dose. Do not take double or extra doses. What may interact with this medicine? This medicine may interact with the following medications: -alosetron -anticholinergics -bupropion -certain antibiotics like ciprofloxacin, clarithromycin, rifampin -cyclosporine -eltrombopag -ergot alkaloids like dihydroergotamine, ergonovine, ergotamine, methylergonovine -fluconazole -gemfibrozil -loperamide -opiate agonist -paroxetine -pimozide -probenecid -quinidine -rosuvastatin -sirolimus -tacrolimus This list may not describe all possible interactions. Give your health care provider a list of all the medicines, herbs, non-prescription drugs, or dietary supplements you use. Also tell them if you smoke, drink alcohol, or use illegal drugs. Some items may interact with your medicine. What should I watch for while using this medicine? Tell your doctor or healthcare  professional if your symptoms do not start to get better or if they get worse. This medicine may cause constipation. If you do not have a bowel movement, call your doctor or healthcare professional. You may get drowsy or dizzy. Do not drive, use machinery, or do anything that needs mental alertness until you know how this medicine affects you. Do not stand or sit up quickly, especially if you are an older patient. This reduces the risk of dizzy or fainting spells. Alcohol may  interfere with the effect of this medicine. Avoid alcoholic drinks. What side effects may I notice from receiving this medicine? Side effects that you should report to your doctor or health care professional as soon as possible: -allergic reactions like skin rash, itching or hives, swelling of the face, lips, or tongue -breathing problems -constipation -right upper belly pain Side effects that usually do not require medical attention (report to your doctor or health care professional if they continue or are bothersome): -dizziness -nausea, vomiting -tiredness This list may not describe all possible side effects. Call your doctor for medical advice about side effects. You may report side effects to FDA at 1-800-FDA-1088. Where should I keep my medicine? Keep out of the reach of children. Store at room temperature between 20 and 25 degrees C (68 and 77 degrees F). Throw away any unused medicine after the expiration date. NOTE: This sheet is a summary. It may not cover all possible information. If you have questions about this medicine, talk to your doctor, pharmacist, or health care provider.  2018 Elsevier/Gold Standard (2015-10-30 11:03:55)

## 2017-01-04 ENCOUNTER — Encounter: Payer: Self-pay | Admitting: Family Medicine

## 2017-01-05 ENCOUNTER — Ambulatory Visit (INDEPENDENT_AMBULATORY_CARE_PROVIDER_SITE_OTHER): Payer: BLUE CROSS/BLUE SHIELD

## 2017-01-05 DIAGNOSIS — K760 Fatty (change of) liver, not elsewhere classified: Secondary | ICD-10-CM

## 2017-01-05 DIAGNOSIS — R1030 Lower abdominal pain, unspecified: Secondary | ICD-10-CM

## 2017-01-05 MED ORDER — IOPAMIDOL (ISOVUE-300) INJECTION 61%
100.0000 mL | Freq: Once | INTRAVENOUS | Status: AC | PRN
  Administered 2017-01-05: 100 mL via INTRAVENOUS

## 2017-01-06 ENCOUNTER — Other Ambulatory Visit: Payer: BLUE CROSS/BLUE SHIELD

## 2017-01-07 MED ORDER — HYOSCYAMINE SULFATE 0.125 MG PO TABS: 0.1250 mg | ORAL_TABLET | ORAL | 3 refills | Status: DC | PRN

## 2017-01-31 ENCOUNTER — Ambulatory Visit: Payer: BLUE CROSS/BLUE SHIELD | Admitting: Family Medicine

## 2017-02-01 ENCOUNTER — Encounter: Payer: Self-pay | Admitting: Family Medicine

## 2017-02-01 MED ORDER — LEVOTHYROXINE SODIUM 112 MCG PO TABS: 112.0000 ug | ORAL_TABLET | Freq: Every day | ORAL | 1 refills | Status: DC

## 2017-02-01 MED ORDER — ALBUTEROL SULFATE HFA 108 (90 BASE) MCG/ACT IN AERS: 2.0000 | INHALATION_SPRAY | Freq: Four times a day (QID) | RESPIRATORY_TRACT | 11 refills | Status: DC | PRN

## 2017-04-06 ENCOUNTER — Encounter: Payer: Self-pay | Admitting: Family Medicine

## 2017-04-07 MED ORDER — LEVOTHYROXINE SODIUM 112 MCG PO TABS: 112.0000 ug | ORAL_TABLET | Freq: Every day | ORAL | 1 refills | Status: DC

## 2017-04-07 MED ORDER — LEVOTHYROXINE SODIUM 100 MCG PO TABS: 100.0000 ug | ORAL_TABLET | Freq: Every day | ORAL | 1 refills | Status: DC

## 2017-06-20 ENCOUNTER — Other Ambulatory Visit: Payer: Self-pay | Admitting: Family Medicine

## 2017-06-20 ENCOUNTER — Encounter: Payer: Self-pay | Admitting: Family Medicine

## 2017-06-20 ENCOUNTER — Ambulatory Visit (INDEPENDENT_AMBULATORY_CARE_PROVIDER_SITE_OTHER): Payer: BLUE CROSS/BLUE SHIELD | Admitting: Family Medicine

## 2017-06-20 VITALS — BP 112/78 | HR 71 | Wt 165.0 lb

## 2017-06-20 DIAGNOSIS — L989 Disorder of the skin and subcutaneous tissue, unspecified: Secondary | ICD-10-CM

## 2017-06-20 DIAGNOSIS — Z23 Encounter for immunization: Secondary | ICD-10-CM

## 2017-06-20 DIAGNOSIS — E038 Other specified hypothyroidism: Secondary | ICD-10-CM | POA: Diagnosis not present

## 2017-06-20 DIAGNOSIS — Z114 Encounter for screening for human immunodeficiency virus [HIV]: Secondary | ICD-10-CM

## 2017-06-20 LAB — TSH: TSH: 4.38 m[IU]/L (ref 0.40–4.50)

## 2017-06-20 NOTE — Progress Notes (Signed)
       Austin Miranda is a 31 y.o. male who [facility] today for an annual well-check. Patient is doing well with minimum symptoms. Patient does report appearance for 4-5 hyperpigmented, macular spots on right nose, first appearing last April. The lesions are primarily on right nose and face, though a new similar lesion presented approximately 1 week ago on left side of nose. He does not report any itching or symptoms. Lesions have never been purulent in nature. Patient believes that they correlate with increased sun exposure as he has recently taken up cycling.  Otherwise doing well. No new concerns.  Would like to change to generic levothyroxine today, so will evaluate TSH. Currently on synthroid 112.   Past Medical History:  Diagnosis Date  . Asthma, chronic 04/02/2015  . Dyslipidemia 03/17/2016  . Hypothyroidism 04/02/2015   No past surgical history on file. Social History   Tobacco Use  . Smoking status: Former Games developermoker  . Smokeless tobacco: Never Used  Substance Use Topics  . Alcohol use: No    Alcohol/week: 0.0 oz   family history includes Diabetes in his mother; Hypertension in his mother.  ROS as above:  Medications: Current Outpatient Medications  Medication Sig Dispense Refill  . albuterol (PROVENTIL HFA;VENTOLIN HFA) 108 (90 Base) MCG/ACT inhaler Inhale 2 puffs into the lungs every 6 (six) hours as needed for wheezing. 2 Inhaler 11  . levothyroxine (SYNTHROID, LEVOTHROID) 112 MCG tablet Take 1 tablet (112 mcg total) by mouth daily. 90 tablet 1   No current facility-administered medications for this visit.    No Known Allergies  Health Maintenance Health Maintenance  Topic Date Due  . HIV Screening  08/15/1999  . INFLUENZA VACCINE  03/16/2017  . TETANUS/TDAP  03/17/2026     Exam:  BP 112/78   Pulse 71   Wt 165 lb (74.8 kg)   BMI 24.37  kg/m  Gen: Well NAD HEENT: EOMI,  MMM Lungs: Normal work of breathing. CTABL. No crackles Heart: RRR no MRG Abd: NABS, Soft. Nondistended, Nontender Exts: Brisk capillary refill, warm and well perfused. No peripheral edema. Skin: 5-6 macular hyperpigmented lesions on right face, nose. No lesions on abdomen, back, or lower extremities.   No results found for this or any previous visit (from the past 72 hour(s)). No results found.    Assessment and Plan: 31 y.o. male with asthma, dyslipidemia, and hypothyroidism here for well exam. 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 Melanoma appears very unlikely. Will refer to dermatology. Hypothyroidism - f/u TSH. Will rescribe generic levothyroxine.  Labs: will draw TSH. HIV screening (no symptoms, patient preference).  Will defer cholesterol labs to future visit.   Orders Placed This Encounter  Procedures  . TSH  . HIV antibody   No orders of the defined types were placed in this encounter.    Discussed warning signs or symptoms. Please see discharge instructions. Patient expresses understanding.

## 2017-06-20 NOTE — Patient Instructions (Signed)
Thank you for coming in today. Get labs today.  I will send results to you likely tomorrow. I will send in a new prescription for generic levothyroxine.  You should hear from Dermatology soon.  Let me know if you do not hear anything soon.    Lichen Planus Lichen planus is a skin problem that causes redness, itching, small bumps, and sores. It can affect the skin in any area of the body. Some common areas affected include:  Arms, wrists, legs, or ankles.  Chest, back, or abdomen.  Genital areas such as the vulva and vagina.  Gums and inside of the mouth.  Scalp.  Fingernails or toenails.  Treatment can help control the symptoms of this condition. The condition can last for a long time. It can take 6-18 months for it to go away. What are the causes? The exact cause of this condition is not known. The condition is not passed from one person to another (not contagious). It may be related to an allergy or an autoimmune response. An autoimmune response occurs when the body's defense system (immune system) mistakenly attacks healthy tissues. What increases the risk? This condition is more likely to develop in:  People who are older than 33 years of age.  People who take certain medicines.  People who have been exposed to certain dyes or chemicals.  People with hepatitis C.  What are the signs or symptoms? Symptoms of this condition may include:  Itching, which can be severe.  Small reddish or purple bumps on the skin. These may have flat tops and may be round or irregular shaped.  Redness or white patches on the gums or tongue.  Redness, soreness, or a burning feeling in the genital area. This may lead to pain or bleeding during sex.  Changes in the fingernails or toenails. The nails may become thin or rough. They may have ridges in them.  Redness or irritation of the eyes. This is rare.  How is this diagnosed? This condition may be diagnosed based on:  A physical  exam. The health care provider will examine your affected skin and check for changes inside your mouth.  Removal of a tissue sample (biopsy sample) to be looked at under a microscope.  How is this treated? Treatment for this condition may depend on the severity of symptoms. In some cases, no treatment is needed. If treatment is needed to control symptoms, it may include:  Creams or ointments (topical steroids) to help control itching and irritation.  Medicine to be taken by mouth.  A treatment in which your skin is exposed to ultraviolet light (phototherapy).  Lozenges that you suck on to help treat sores in the mouth.  Follow these instructions at home:  Take over-the-counter and prescription medicines only as told by your health care provider.  Use creams or ointments as told by your health care provider.  Do not scratch the affected areas of skin.  Women should keep the vaginal area as clean and dry as possible. Contact a health care provider if:  You have increasing redness, swelling, or pain in the affected area.  You have fluid, blood, or pus coming from the affected area.  Your eyes become irritated. This information is not intended to replace advice given to you by your health care provider. Make sure you discuss any questions you have with your health care provider. Document Released: 12/24/2010 Document Revised: 01/08/2016 Document Reviewed: 10/28/2014 Elsevier Interactive Patient Education  [employer]2018 Elsevier Inc.

## 2017-06-21 ENCOUNTER — Other Ambulatory Visit: Payer: Self-pay | Admitting: Family Medicine

## 2017-06-21 LAB — HIV ANTIBODY (ROUTINE TESTING W REFLEX): HIV: NONREACTIVE

## 2017-06-21 MED ORDER — LEVOTHYROXINE SODIUM 112 MCG PO TABS: 112.0000 ug | ORAL_TABLET | Freq: Every day | ORAL | 1 refills | Status: DC

## 2017-06-28 ENCOUNTER — Other Ambulatory Visit: Payer: Self-pay | Admitting: *Deleted

## 2017-06-28 MED ORDER — LEVOTHYROXINE SODIUM 112 MCG PO TABS: 112.0000 ug | ORAL_TABLET | Freq: Every day | ORAL | 1 refills | Status: DC

## 2017-12-12 ENCOUNTER — Other Ambulatory Visit: Payer: Self-pay | Admitting: Family Medicine

## 2018-01-01 IMAGING — CT CT ABD-PELV W/ CM
2 of 4 series · 16 of 46 positions shown, 18 images · IV contrast (APPLIED)
Comparison: None

CLINICAL DATA: Abdominal pain off and on for several weeks,
cramping lower abdominal pain associated with intermittent diarrhea
and constipation, history asthma, former smoker

EXAM:
CT ABDOMEN AND PELVIS WITH CONTRAST
TECHNIQUE: Multidetector CT imaging of the abdomen and pelvis was performed
using the standard protocol following bolus administration of
intravenous contrast.
CONTRAST:  100mL [id number] IOPAMIDOL ([id number]-NVV) INJECTION 61% IV.
Dilute oral contrast.

[Series 2: axial st · axial · 0.76mm/px · z∈[-540,-120]mm · 13 of 92 slices shown, 15 images]
[im 4/92  soft-tissue]
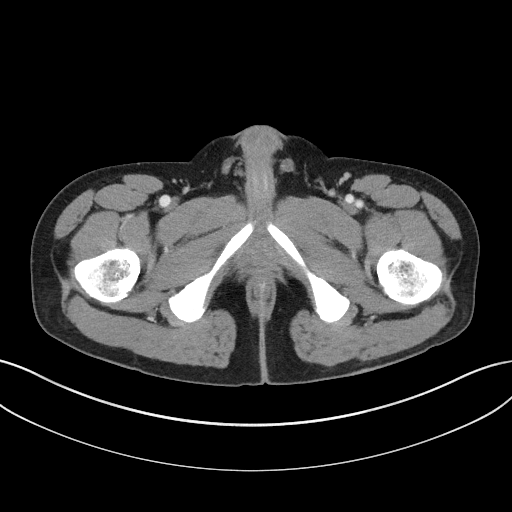
[im 4/92  bone]
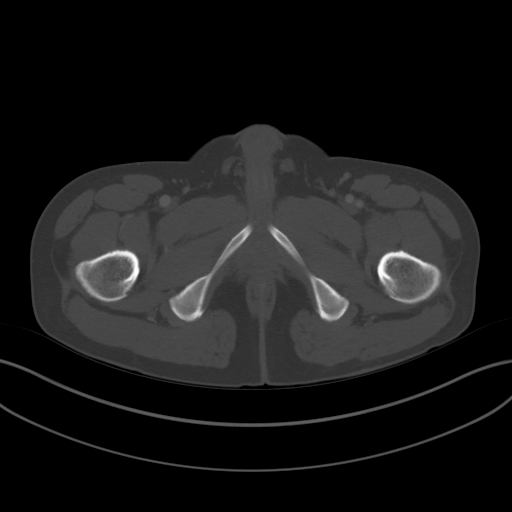
[im 12/92  soft-tissue]
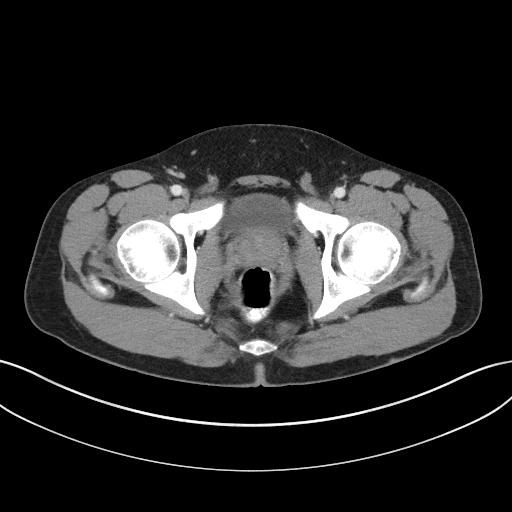
[im 19/92  soft-tissue]
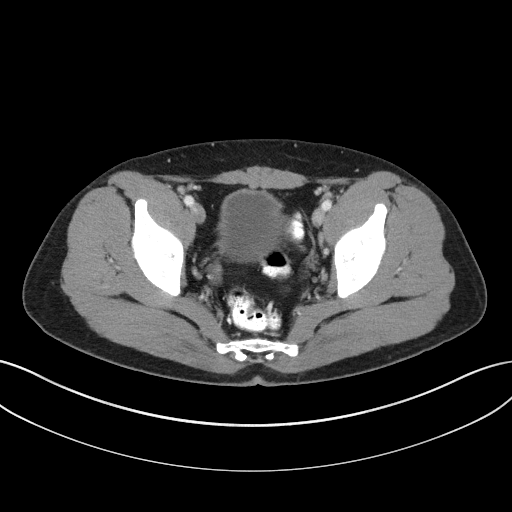
[im 27/92  soft-tissue]
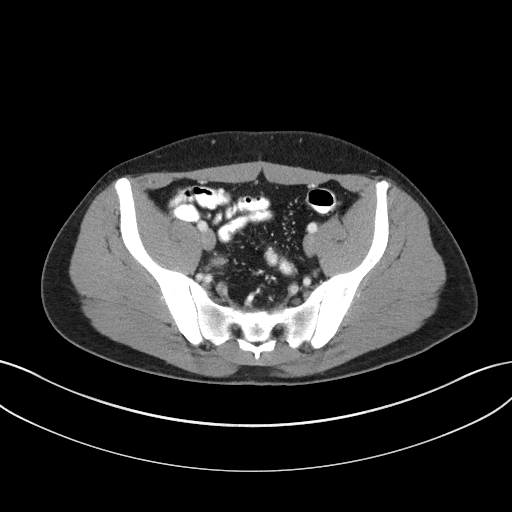
[im 31/92  soft-tissue]
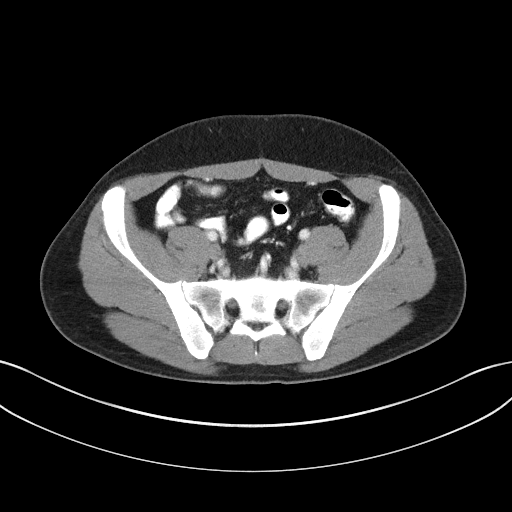
[im 38/92  soft-tissue]
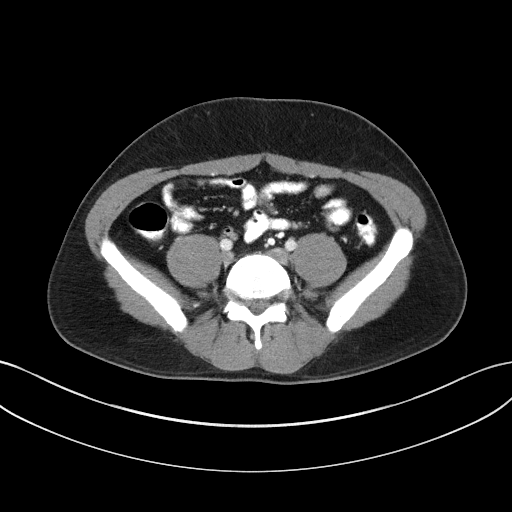
[im 46/92  soft-tissue]
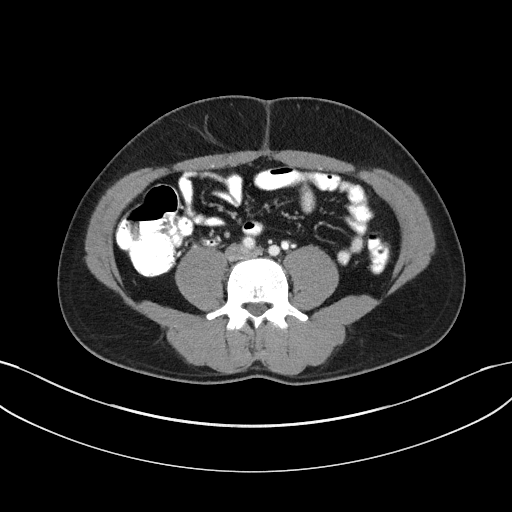
[im 54/92  soft-tissue]
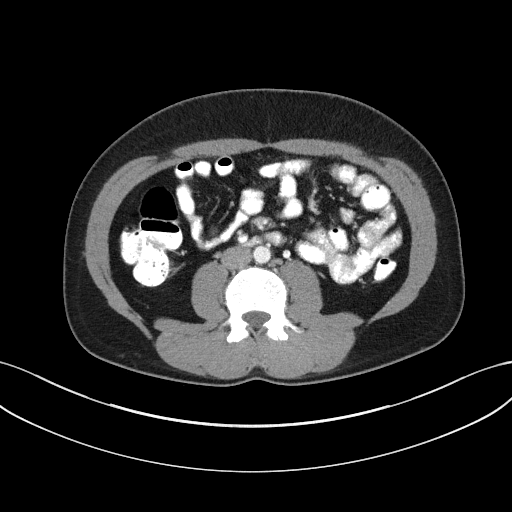
[im 61/92  soft-tissue]
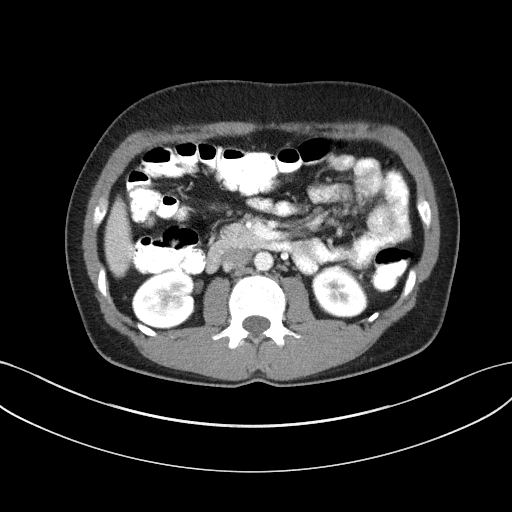
[im 61/92  bone]
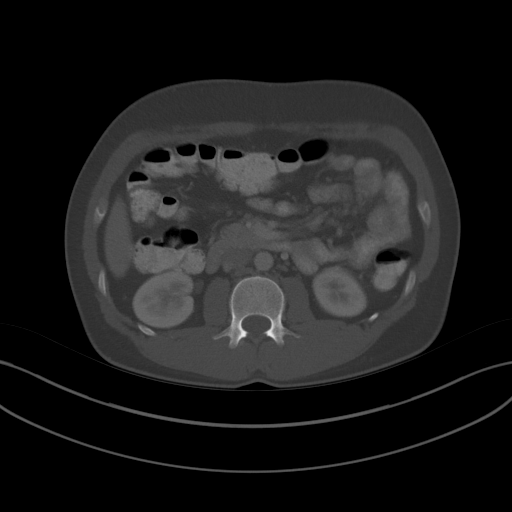
[im 65/92  soft-tissue]
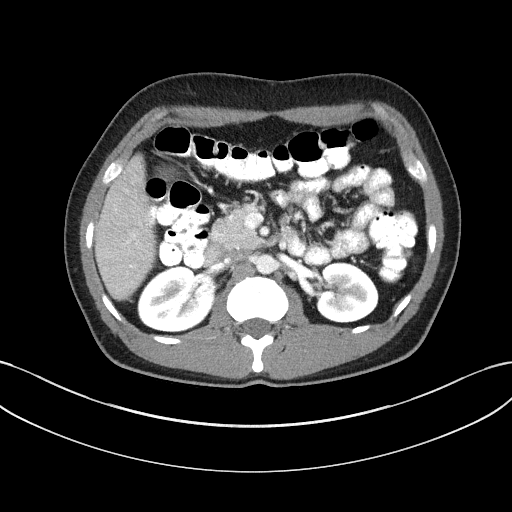
[im 73/92  soft-tissue]
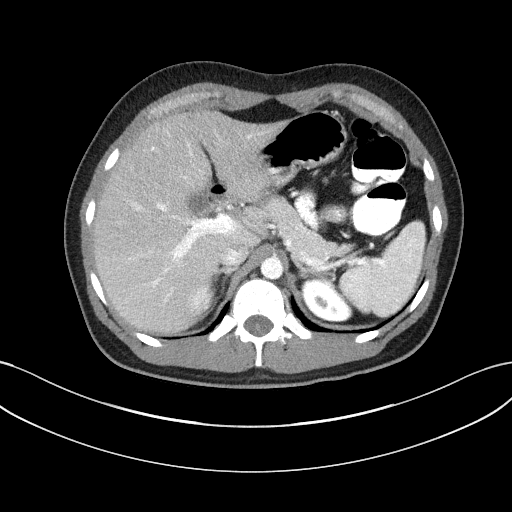
[im 80/92  soft-tissue]
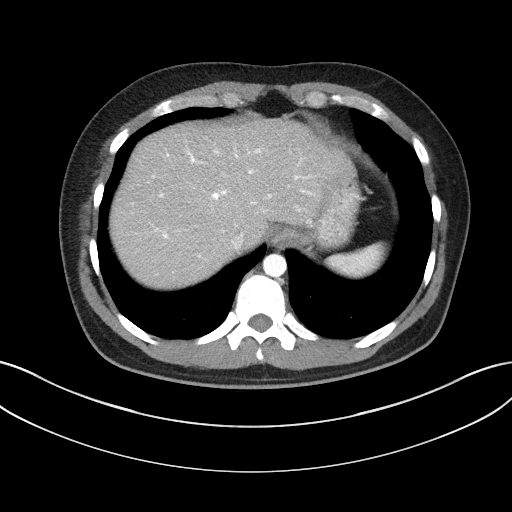
[im 88/92  soft-tissue]
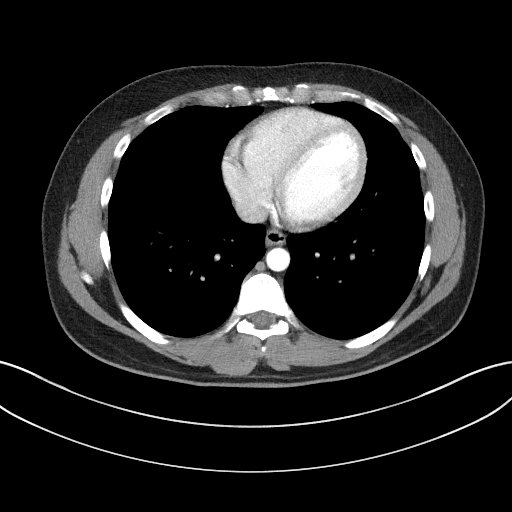

[Series 5: coronal st · coronal · 0.71mm/px · 3 of 85 slices shown]
[im 29/85  soft-tissue]
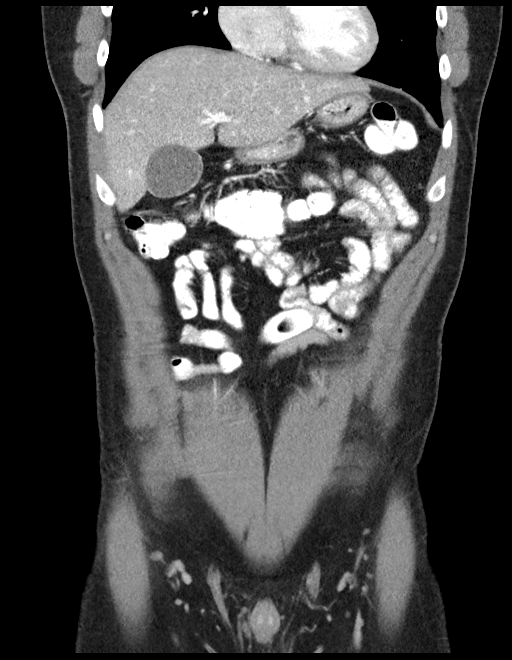
[im 38/85  soft-tissue]
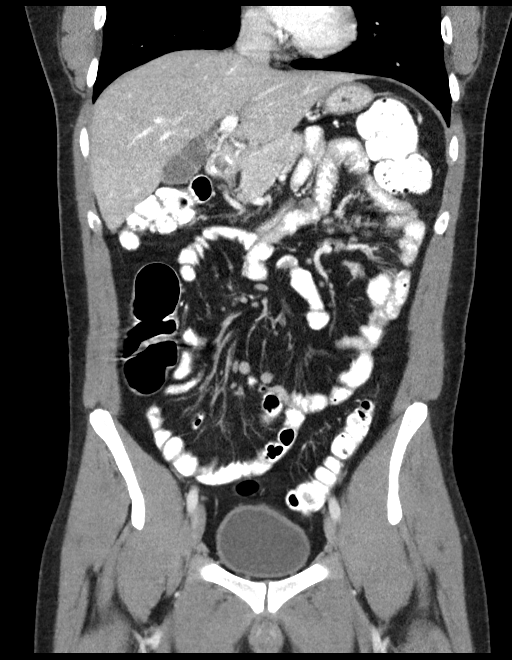
[im 47/85  soft-tissue]
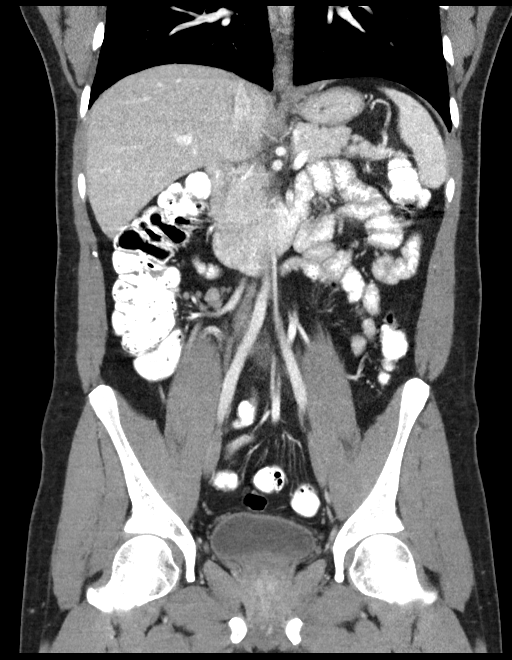

[16 of 46 positions shown; findings below may reference images not displayed]

FINDINGS: Lower chest: Lung bases clear

Hepatobiliary: Mild focal fatty infiltration of liver adjacent to
falciform fissure. Gallbladder and liver otherwise normal
appearance.

Pancreas: Normal appearance

Spleen: Normal appearance.  Tiny splenule inferior to spleen.

Adrenals/Urinary Tract: Adrenal glands normal appearance. Kidneys,
ureters, and bladder normal appearance.

Stomach/Bowel: Normal appendix. Stomach and bowel loops normal
appearance

Vascular/Lymphatic: Aorta normal caliber. Vascular structures
unremarkable. No adenopathy

Reproductive: Unremarkable prostate gland and seminal vesicles

Other: No free air or free fluid.  No hernia.

Musculoskeletal: Unremarkable
IMPRESSION: Minimal focal fatty infiltration of liver adjacent to falciform
fissure.

Otherwise normal exam.

## 2018-02-24 ENCOUNTER — Emergency Department (INDEPENDENT_AMBULATORY_CARE_PROVIDER_SITE_OTHER)
Admission: EM | Admit: 2018-02-24 | Discharge: 2018-02-24 | Disposition: A | Discharge status: Home / Self Care | Payer: BLUE CROSS/BLUE SHIELD | Source: Home / Self Care | Attending: Family Medicine | Admitting: Family Medicine

## 2018-02-24 ENCOUNTER — Encounter: Payer: Self-pay | Admitting: Emergency Medicine

## 2018-02-24 ENCOUNTER — Other Ambulatory Visit: Payer: Self-pay

## 2018-02-24 DIAGNOSIS — R509 Fever, unspecified: Secondary | ICD-10-CM

## 2018-02-24 DIAGNOSIS — J101 Influenza due to other identified influenza virus with other respiratory manifestations: Secondary | ICD-10-CM

## 2018-02-24 LAB — POCT INFLUENZA A/B
INFLUENZA B, POC: NEGATIVE
Influenza A, POC: POSITIVE — AB

## 2018-02-24 MED ORDER — OSELTAMIVIR PHOSPHATE 75 MG PO CAPS: 75.0000 mg | ORAL_CAPSULE | Freq: Two times a day (BID) | ORAL | 0 refills | Status: DC

## 2018-02-24 MED ORDER — PREDNISONE 50 MG PO TABS: ORAL_TABLET | ORAL | 0 refills | Status: DC

## 2018-02-24 NOTE — ED Triage Notes (Signed)
Fever up to 102.5, headache, body aches, dry cough, rt ear pain x 2 days

## 2018-02-24 NOTE — ED Provider Notes (Signed)
[facility]    CSN: [id number] Arrival date & time: 02/24/18  0851     History   Chief Complaint Chief Complaint  Patient presents with  . Fever    HPI Austin Miranda is a 32 y.o. male.   Complains of 2 day history flu-like illness including myalgias, headache, fever/chills, fatigue, and cough.  Also has mild nasal congestion and right earache but no sore throat.  Cough is non-productive and somewhat worse at night.  No pleuritic pain or shortness of breath.  His fever has ranged from 100 to 102.  [place] five [place] ago.  He has a history of exercise asthma, but has not needed his rescue albuterol inhaler recently.  The history is provided by the patient and the spouse.    Past Medical History:  Diagnosis Date  . Asthma, chronic 04/02/2015  . Dyslipidemia 03/17/2016  . Hypothyroidism 04/02/2015    Patient Active Problem List   Diagnosis Date Noted  . Abdominal pain 01/03/2017  . Dyslipidemia 03/17/2016  . Hypothyroidism 04/02/2015  . Asthma, chronic 04/02/2015    History reviewed. No pertinent surgical history.     Home Medications    Prior to Admission medications   Medication Sig Start Date End Date Taking? Authorizing Provider  Pseudoeph-Doxylamine-DM-APAP (DAYQUIL/NYQUIL COLD/FLU RELIEF PO) Take by mouth.   Yes [provider]  albuterol (PROVENTIL HFA;VENTOLIN HFA) 108 (90 Base) MCG/ACT inhaler Inhale 2 puffs into the lungs every 6 (six) hours as needed for wheezing. 02/01/17   Rodolph Bongorey, Evan S, MD  levothyroxine (SYNTHROID, LEVOTHROID) 112 MCG tablet TAKE 1 TABLET DAILY 12/12/17   Rodolph Bongorey, Evan S, MD  oseltamivir (TAMIFLU) 75 MG capsule Take 1 capsule (75 mg total) by mouth every 12 (twelve) hours. 02/24/18   Lattie HawBeese, Zilah Villaflor A, MD  predniSONE (DELTASONE) 50 MG tablet Take one tab by mouth with food once daily for five days 02/24/18   Lattie HawBeese, Argelio Granier A, MD    Family History Family History  Problem Relation Age of  Onset  . Hypertension Mother   . Diabetes Mother     Social History Social History   Tobacco Use  . Smoking status: Former Games developermoker  . Smokeless tobacco: Never Used  Substance Use Topics  . Alcohol use: No    Alcohol/week: 0.0 oz  . Drug use: No     Allergies   Patient has no known allergies.   Review of Systems Review of Systems No sore throat + cough No pleuritic pain No wheezing + nasal congestion + post-nasal drainage No sinus pain/pressure No itchy/red eyes ? right earache No hemoptysis No SOB + fever, + chills No nausea No vomiting No abdominal pain No diarrhea No urinary symptoms No skin rash + fatigue + myalgias + headache Used OTC meds without relief   Physical Exam Triage Vital Signs ED Triage Vitals  Enc Vitals Group     BP 02/24/18 0930 (!) 128/91     Pulse Rate 02/24/18 0930 (!) 105     Resp --      Temp 02/24/18 0930 99.5 F (37.5 C)     Temp Source 02/24/18 0930 Oral     SpO2 02/24/18 0930 97 %     Weight 02/24/18 0931 162 lb (73.5 kg)     Height 02/24/18 0931 5\' 9"  (1.753 m)     Head Circumference --      Peak Flow --      Pain Score 02/24/18  0930 4     Pain Loc --      Pain Edu? --      Excl. in GC? --    No data found.  Updated Vital Signs BP (!) 128/91 (BP Location: Right Arm)   Pulse (!) 105   Temp 99.5 F (37.5 C) (Oral)   Ht 5\' 9"  (1.753 m)   Wt 162 lb (73.5 kg)   SpO2 97%   BMI 23.92 kg/m   Visual Acuity Right Eye Distance:   Left Eye Distance:   Bilateral Distance:    Right Eye Near:   Left Eye Near:    Bilateral Near:     Physical Exam Nursing notes and Vital Signs reviewed. Appearance:  Patient appears stated age, and in no acute distress Eyes:  Pupils are equal, round, and reactive to light and accomodation.  Extraocular movement is intact.  Conjunctivae are not inflamed  Ears:  Canals normal.  Tympanic membranes normal.  Nose:  Mildly congested turbinates.  No sinus tenderness.   Pharynx:   Normal Neck:  Supple.  Enlarged posterior/lateral nodes are palpated bilaterally, tender to palpation on the left.   Lungs:  Clear to auscultation.  Breath sounds are equal.  Moving air well. Heart:  Regular rate and rhythm without murmurs, rubs, or gallops.  Abdomen:  Nontender without masses or hepatosplenomegaly.  Bowel sounds are present.  No CVA or flank tenderness.  Extremities:  No edema.  Skin:  No rash present.    UC Treatments / Results  Labs (all labs ordered are listed, but only abnormal results are displayed) Labs Reviewed  POCT INFLUENZA A/B positive    EKG None  Radiology No results found.  Procedures Procedures (including critical care time)  Medications Ordered in UC Medications - No data to display  Initial Impression / Assessment and Plan / UC Course  I have reviewed the triage vital signs and the nursing notes.  Pertinent labs & imaging results that were available during my care of the patient were reviewed by me and considered in my medical decision making (see chart for details).    Begin Tamiflu. Because of his history of exercise asthma, will begin prednisone burst. Followup with Family Doctor if not improved in about 5 days.   Final Clinical Impressions(s) / UC Diagnoses   Final diagnoses:  Fever, unspecified  Influenza A     Discharge Instructions     Take plain guaifenesin (1200mg  extended release tabs such as Mucinex) twice daily, with plenty of water, for cough and congestion.  May add Pseudoephedrine (30mg , one or two every 4 to 6 hours) for sinus congestion.  Get adequate rest.   May take Delsym Cough Suppressant at bedtime for nighttime cough.  May use albuterol inhaler as needed. Try warm salt water gargles for sore throat.  Stop all antihistamines for now, and other non-prescription cough/cold preparations. May take Tylenol as needed for fever, headache, body aches, etc.    ED Prescriptions    Medication Sig Dispense Auth.  Provider   oseltamivir (TAMIFLU) 75 MG capsule Take 1 capsule (75 mg total) by mouth every 12 (twelve) hours. 10 capsule Lattie Haw, MD   predniSONE (DELTASONE) 50 MG tablet Take one tab by mouth with food once daily for five days 5 tablet Lattie Haw, MD         Lattie Haw, MD 02/24/18 1037

## 2018-02-24 NOTE — Discharge Instructions (Signed)
Take plain guaifenesin (1200mg  extended release tabs such as Mucinex) twice daily, with plenty of water, for cough and congestion.  May add Pseudoephedrine (30mg , one or two every 4 to 6 hours) for sinus congestion.  Get adequate rest.   May take Delsym Cough Suppressant at bedtime for nighttime cough.  May use albuterol inhaler as needed. Try warm salt water gargles for sore throat.  Stop all antihistamines for now, and other non-prescription cough/cold preparations. May take Tylenol as needed for fever, headache, body aches, etc.

## 2018-05-23 ENCOUNTER — Ambulatory Visit (INDEPENDENT_AMBULATORY_CARE_PROVIDER_SITE_OTHER): Payer: BLUE CROSS/BLUE SHIELD | Admitting: Family Medicine

## 2018-05-23 ENCOUNTER — Encounter: Payer: Self-pay | Admitting: Family Medicine

## 2018-05-23 VITALS — BP 120/70 | HR 61 | Ht 69.0 in | Wt 161.0 lb

## 2018-05-23 DIAGNOSIS — J4520 Mild intermittent asthma, uncomplicated: Secondary | ICD-10-CM | POA: Diagnosis not present

## 2018-05-23 DIAGNOSIS — E038 Other specified hypothyroidism: Secondary | ICD-10-CM | POA: Diagnosis not present

## 2018-05-23 DIAGNOSIS — Z5181 Encounter for therapeutic drug level monitoring: Secondary | ICD-10-CM | POA: Insufficient documentation

## 2018-05-23 DIAGNOSIS — L932 Other local lupus erythematosus: Secondary | ICD-10-CM

## 2018-05-23 DIAGNOSIS — E785 Hyperlipidemia, unspecified: Secondary | ICD-10-CM

## 2018-05-23 DIAGNOSIS — Z0000 Encounter for general adult medical examination without abnormal findings: Secondary | ICD-10-CM | POA: Diagnosis not present

## 2018-05-23 DIAGNOSIS — Z6823 Body mass index (BMI) 23.0-23.9, adult: Secondary | ICD-10-CM

## 2018-05-23 HISTORY — DX: Other local lupus erythematosus: L93.2

## 2018-05-23 LAB — COMPLETE METABOLIC PANEL WITH GFR
AG Ratio: 2.1 (calc) (ref 1.0–2.5)
ALBUMIN MSPROF: 4.8 g/dL (ref 3.6–5.1)
ALT: 32 U/L (ref 9–46)
AST: 23 U/L (ref 10–40)
Alkaline phosphatase (APISO): 55 U/L (ref 40–115)
BUN: 9 mg/dL (ref 7–25)
CALCIUM: 9.8 mg/dL (ref 8.6–10.3)
CO2: 29 mmol/L (ref 20–32)
CREATININE: 0.9 mg/dL (ref 0.60–1.35)
Chloride: 104 mmol/L (ref 98–110)
GFR, EST NON AFRICAN AMERICAN: 112 mL/min/{1.73_m2} (ref >=60)
GFR, Est African American: 130 mL/min/{1.73_m2} (ref >=60)
GLOBULIN: 2.3 g/dL (ref 1.9–3.7)
Glucose, Bld: 86 mg/dL (ref 65–99)
Potassium: 4.8 mmol/L (ref 3.5–5.3)
Sodium: 139 mmol/L (ref 135–146)
Total Bilirubin: 0.8 mg/dL (ref 0.2–1.2)
Total Protein: 7.1 g/dL (ref 6.1–8.1)

## 2018-05-23 LAB — CBC
HCT: 49.3 % (ref 38.5–50.0)
HEMOGLOBIN: 16.3 g/dL (ref 13.2–17.1)
MCH: 27.7 pg (ref 27.0–33.0)
MCHC: 33.1 g/dL (ref 32.0–36.0)
MCV: 83.8 fL (ref 80.0–100.0)
MPV: 11.3 fL (ref 7.5–12.5)
Platelets: 300 10*3/uL (ref 140–400)
RBC: 5.88 10*6/uL — AB (ref 4.20–5.80)
RDW: 15 % (ref 11.0–15.0)
WBC: 3.1 10*3/uL — AB (ref 3.8–10.8)

## 2018-05-23 LAB — T4, FREE: FREE T4: 1.4 ng/dL (ref 0.8–1.8)

## 2018-05-23 LAB — LIPID PANEL W/REFLEX DIRECT LDL
CHOL/HDL RATIO: 5.4 (calc) — AB (ref <5.0)
Cholesterol: 184 mg/dL (ref <200)
HDL: 34 mg/dL — AB (ref >40)
LDL CHOLESTEROL (CALC): 126 mg/dL — AB
NON-HDL CHOLESTEROL (CALC): 150 mg/dL — AB (ref <130)
Triglycerides: 125 mg/dL (ref <150)

## 2018-05-23 LAB — T3, FREE: T3, Free: 3.3 pg/mL (ref 2.3–4.2)

## 2018-05-23 LAB — TSH: TSH: 3.17 mIU/L (ref 0.40–4.50)

## 2018-05-23 MED ORDER — LEVOTHYROXINE SODIUM 112 MCG PO TABS: 112.0000 ug | ORAL_TABLET | Freq: Every day | ORAL | 1 refills | Status: DC

## 2018-05-23 MED ORDER — ALBUTEROL SULFATE HFA 108 (90 BASE) MCG/ACT IN AERS: 2.0000 | INHALATION_SPRAY | Freq: Four times a day (QID) | RESPIRATORY_TRACT | 11 refills | Status: DC | PRN

## 2018-05-23 NOTE — Progress Notes (Signed)
Calbert Hulsebus is a 32 y.o. male who presents to [facility] today for well adult visit.  Merlon has done well over the last year.  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  He takes Plaquenil and tolerates it well.  He notes this is helped his rash significantly.  He also takes levothyroxine for hypothyroidism.  He feels well with no issues.  No fevers chills nausea vomiting or diarrhea.  He tries eat a careful diet.   ROS as above:  Past Medical History:  Diagnosis Date  . Asthma, chronic 04/02/2015  . 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 Dyslipidemia 03/17/2016  . Hypothyroidism 04/02/2015   History reviewed. No pertinent surgical history. Social History   Tobacco Use  . Smoking status: Former Games developer  . Smokeless tobacco: Never Used  Substance Use Topics  . Alcohol use: No    Alcohol/week: 0.0 standard drinks   family history includes Diabetes in his mother; Hypertension in his mother.  Medications: Current Outpatient Medications  Medication Sig Dispense Refill  . albuterol (PROVENTIL HFA;VENTOLIN HFA) 108 (90 Base) MCG/ACT inhaler Inhale 2 puffs into the lungs every 6 (six) hours as needed for wheezing. 2 Inhaler 11  . levothyroxine (SYNTHROID, LEVOTHROID) 112 MCG tablet Take 1 tablet (112 mcg total) by mouth daily. 90 tablet 1  . Multiple Vitamin (MULTIVITAMIN WITH MINERALS) TABS tablet Take 1 tablet by mouth daily.    . Omega-3 Fatty Acids (FISH OIL) 1000 MG CAPS Take 1,000 mg by mouth daily.    . hydroxychloroquine (PLAQUENIL) 200 MG tablet Take 200 mg by mouth daily.     No current facility-administered medications for this visit.    No Known Allergies  Health Maintenance Health Maintenance  Topic Date Due  . TETANUS/TDAP  03/17/2026  . INFLUENZA VACCINE   Completed  . HIV Screening  Completed     Exam:  BP 120/70   Pulse 61   Ht 5\' 9"  (1.753 m)   Wt 161 lb (73 kg)   BMI 23.78 kg/m  Wt Readings from Last 5 Encounters:  05/23/18 161 lb (73 kg)  02/24/18 162 lb (73.5 kg)  06/20/17 165 lb (74.8 kg)  01/03/17 157 lb (71.2 kg)  12/16/16 164 lb (74.4 kg)      Gen: Well NAD HEENT: EOMI,  MMM Lungs: Normal work of breathing. CTABL Heart: RRR no MRG Abd: NABS, Soft. Nondistended, Nontender Exts: Brisk capillary refill, warm and well perfused.  Psych: Alert and oriented normal speech thought process and affect.  Depression screen PHQ 2/9 05/23/2018  Decreased Interest 0  Down, Depressed, Hopeless 0  PHQ - 2 Score 0  Altered sleeping 0  Tired, decreased energy 0  Change in appetite 0  Feeling bad or failure about yourself  0  Trouble concentrating 0  Moving slowly or fidgety/restless 0  Suicidal thoughts 0  PHQ-9 Score 0  Difficult doing work/chores Not difficult at all       Assessment and Plan: 32 y.o. male with well adult visit.  Doing reasonably well.  Plan to check basic labs to follow-up hypothyroidism lupus and mild hyperlipidemia.  If all is well continue current regimen and recheck yearly.  Will send records and labs to dermatology as well.  Recheck sooner if needed.   Orders Placed This Encounter  Procedures  . CBC  . COMPLETE METABOLIC  PANEL WITH GFR  . Lipid Panel w/reflex Direct LDL  . TSH  . T3, free  . T4, free   Meds ordered this encounter  Medications  . albuterol (PROVENTIL HFA;VENTOLIN HFA) 108 (90 Base) MCG/ACT inhaler    Sig: Inhale 2 puffs into the lungs every 6 (six) hours as needed for wheezing.    Dispense:  2 Inhaler    Refill:  11  . levothyroxine (SYNTHROID, LEVOTHROID) 112 MCG tablet    Sig: Take 1 tablet (112 mcg total) by mouth daily.    Dispense:  90 tablet    Refill:  1     Discussed warning signs or symptoms. Please see discharge instructions. Patient expresses  understanding.

## 2018-05-23 NOTE — Patient Instructions (Addendum)
Thank you for coming in today. Continue current medications.  Continue exercise and healthy diet.  Continue sun protection.   Recheck with me yearly if all is well.  Return sooner if needed.   I will request medical reccords and send this note and labs to your Dermatologist.

## 2018-05-30 DIAGNOSIS — L930 Discoid lupus erythematosus: Secondary | ICD-10-CM | POA: Diagnosis not present

## 2018-07-10 DIAGNOSIS — L03011 Cellulitis of right finger: Secondary | ICD-10-CM | POA: Diagnosis not present

## 2018-11-19 ENCOUNTER — Other Ambulatory Visit: Payer: Self-pay | Admitting: Family Medicine

## 2019-05-23 ENCOUNTER — Encounter: Payer: Self-pay | Admitting: Family Medicine

## 2019-05-23 ENCOUNTER — Other Ambulatory Visit: Payer: Self-pay

## 2019-05-23 ENCOUNTER — Ambulatory Visit (INDEPENDENT_AMBULATORY_CARE_PROVIDER_SITE_OTHER): Payer: BC Managed Care – PPO | Admitting: Family Medicine

## 2019-05-23 VITALS — BP 135/84 | HR 70 | Wt 165.0 lb

## 2019-05-23 DIAGNOSIS — E038 Other specified hypothyroidism: Secondary | ICD-10-CM | POA: Diagnosis not present

## 2019-05-23 DIAGNOSIS — L932 Other local lupus erythematosus: Secondary | ICD-10-CM | POA: Diagnosis not present

## 2019-05-23 DIAGNOSIS — J4520 Mild intermittent asthma, uncomplicated: Secondary | ICD-10-CM | POA: Diagnosis not present

## 2019-05-23 DIAGNOSIS — Z0000 Encounter for general adult medical examination without abnormal findings: Secondary | ICD-10-CM

## 2019-05-23 DIAGNOSIS — Z6824 Body mass index (BMI) 24.0-24.9, adult: Secondary | ICD-10-CM

## 2019-05-23 DIAGNOSIS — Z5181 Encounter for therapeutic drug level monitoring: Secondary | ICD-10-CM

## 2019-05-23 DIAGNOSIS — E559 Vitamin D deficiency, unspecified: Secondary | ICD-10-CM

## 2019-05-23 DIAGNOSIS — E785 Hyperlipidemia, unspecified: Secondary | ICD-10-CM

## 2019-05-23 DIAGNOSIS — R7401 Elevation of levels of liver transaminase levels: Secondary | ICD-10-CM

## 2019-05-23 NOTE — Patient Instructions (Addendum)
Thank you for coming in today. Get fasting labs soon.  Recheck yearly is all is well.

## 2019-05-23 NOTE — Progress Notes (Signed)
Austin Miranda is a 33 y.o. male who presents to [facility]: [facility] today for well adult visit.    Patient has a history of cutaneous lupus.  This had been managed with Plaquenil in the past but his symptoms have been extremely well controlled and he has not taken Plaquenil in quite some time.  He does have a follow-up appointment scheduled with his dermatologist in the near future.    Additionally he has hypothyroidism managed with levothyroxine.  TSH was normal when checked 1 year ago.  He has a history of mild hyperlipidemia with LDL 126 checked 1 year ago.  He notes that he is been able to exercise little bit less than usual with COVID-19.  Additionally he can hear his heartbeat in his left ear but otherwise has no other associated signs or symptoms.  His asthma is well controlled with intermittent albuterol.  ROS as above:  Past Medical History:  Diagnosis Date  . Asthma, chronic 04/02/2015  . 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 Dyslipidemia 03/17/2016  . Hypothyroidism 04/02/2015   No past surgical history on file. Social History   Tobacco Use  . Smoking status: Former Research scientist (life sciences)  . Smokeless tobacco: Never Used  Substance Use Topics  . Alcohol use: No    Alcohol/week: 0.0 standard drinks   family history includes Diabetes in his mother; Hypertension in his mother.  Medications: Current Outpatient Medications  Medication Sig Dispense Refill  . albuterol (PROVENTIL HFA;VENTOLIN HFA) 108 (90 Base) MCG/ACT inhaler Inhale 2 puffs into the lungs every 6 (six) hours as needed for wheezing. 2 Inhaler 11  . levothyroxine (SYNTHROID, LEVOTHROID) 112 MCG tablet TAKE 1 TABLET DAILY 90 tablet 3  . Multiple Vitamin (MULTIVITAMIN WITH MINERALS) TABS tablet Take 1 tablet by mouth daily.    . Omega-3 Fatty Acids (FISH OIL) 1000 MG CAPS Take 1,000 mg by mouth daily.      No current facility-administered medications for this visit.    No Known Allergies  Health Maintenance Health Maintenance  Topic Date Due  . TETANUS/TDAP  03/17/2026  . INFLUENZA VACCINE  Completed  . HIV Screening  Completed     Exam:  BP 135/84   Pulse 70   Wt 165 lb (74.8 kg)   BMI 24.37 kg/m  Wt Readings from Last 5 Encounters:  05/23/19 165 lb (74.8 kg)  05/23/18 161 lb (73 kg)  02/24/18 162 lb (73.5 kg)  06/20/17 165 lb (74.8 kg)  01/03/17 157 lb (71.2 kg)      Gen: Well NAD HEENT: EOMI,  MMM normal tympanic membranes bilaterally Lungs: Normal work of breathing. CTABL Heart: RRR no MRG Abd: NABS, Soft. Nondistended, Nontender Exts: Brisk capillary refill, warm and well perfused.  Psych: Alert and oriented normal speech thought process and affect.     Assessment and Plan: 33 y.o. male with well adult.  Doing well.  Plan to check basic fasting labs.  We will go ahead and check vitamin D as well although patient knows that it may not be covered fully.  Recheck yearly in clinic if doing well.  Patient will establish with new PCP.  PDMP not reviewed this encounter. Orders Placed This Encounter  Procedures  . COMPLETE METABOLIC PANEL WITH GFR  . Lipid Panel w/reflex Direct LDL  . TSH  . CBC with Differential/Platelet  . VITAMIN D 25 Hydroxy (Vit-D Deficiency, Fractures)   No orders of the defined types were placed in  this encounter.    Discussed warning signs or symptoms. Please see discharge instructions. Patient expresses understanding.

## 2019-05-24 DIAGNOSIS — Z0000 Encounter for general adult medical examination without abnormal findings: Secondary | ICD-10-CM | POA: Diagnosis not present

## 2019-05-24 DIAGNOSIS — E782 Mixed hyperlipidemia: Secondary | ICD-10-CM | POA: Diagnosis not present

## 2019-05-24 DIAGNOSIS — E785 Hyperlipidemia, unspecified: Secondary | ICD-10-CM | POA: Diagnosis not present

## 2019-05-24 DIAGNOSIS — E038 Other specified hypothyroidism: Secondary | ICD-10-CM | POA: Diagnosis not present

## 2019-05-25 ENCOUNTER — Encounter: Payer: Self-pay | Admitting: Family Medicine

## 2019-05-25 DIAGNOSIS — R7401 Elevation of levels of liver transaminase levels: Secondary | ICD-10-CM | POA: Insufficient documentation

## 2019-05-25 DIAGNOSIS — E559 Vitamin D deficiency, unspecified: Secondary | ICD-10-CM | POA: Insufficient documentation

## 2019-05-25 LAB — CBC WITH DIFFERENTIAL/PLATELET
Absolute Monocytes: 533 cells/uL (ref 200–950)
Basophils Absolute: 38 cells/uL (ref 0–200)
Basophils Relative: 0.8 %
Eosinophils Absolute: 494 cells/uL (ref 15–500)
Eosinophils Relative: 10.3 %
HCT: 49 % (ref 38.5–50.0)
Hemoglobin: 16.2 g/dL (ref 13.2–17.1)
Lymphs Abs: 1958 cells/uL (ref 850–3900)
MCH: 28.8 pg (ref 27.0–33.0)
MCHC: 33.1 g/dL (ref 32.0–36.0)
MCV: 87.2 fL (ref 80.0–100.0)
MPV: 10.7 fL (ref 7.5–12.5)
Monocytes Relative: 11.1 %
Neutro Abs: 1776 cells/uL (ref 1500–7800)
Neutrophils Relative %: 37 %
Platelets: 330 10*3/uL (ref 140–400)
RBC: 5.62 10*6/uL (ref 4.20–5.80)
RDW: 13.7 % (ref 11.0–15.0)
Total Lymphocyte: 40.8 %
WBC: 4.8 10*3/uL (ref 3.8–10.8)

## 2019-05-25 LAB — COMPLETE METABOLIC PANEL WITH GFR
AG Ratio: 1.6 (calc) (ref 1.0–2.5)
ALT: 54 U/L — ABNORMAL HIGH (ref 9–46)
AST: 29 U/L (ref 10–40)
Albumin: 4.5 g/dL (ref 3.6–5.1)
Alkaline phosphatase (APISO): 68 U/L (ref 36–130)
BUN: 11 mg/dL (ref 7–25)
CO2: 27 mmol/L (ref 20–32)
Calcium: 10 mg/dL (ref 8.6–10.3)
Chloride: 104 mmol/L (ref 98–110)
Creat: 1.06 mg/dL (ref 0.60–1.35)
GFR, Est African American: 106 mL/min/{1.73_m2} (ref >=60)
GFR, Est Non African American: 91 mL/min/{1.73_m2} (ref >=60)
Globulin: 2.9 g/dL (calc) (ref 1.9–3.7)
Glucose, Bld: 89 mg/dL (ref 65–99)
Potassium: 4.2 mmol/L (ref 3.5–5.3)
Sodium: 138 mmol/L (ref 135–146)
Total Bilirubin: 0.6 mg/dL (ref 0.2–1.2)
Total Protein: 7.4 g/dL (ref 6.1–8.1)

## 2019-05-25 LAB — LIPID PANEL W/REFLEX DIRECT LDL
Cholesterol: 228 mg/dL — ABNORMAL HIGH (ref <200)
HDL: 33 mg/dL — ABNORMAL LOW (ref >=40)
LDL Cholesterol (Calc): 157 mg/dL (calc) — ABNORMAL HIGH
Non-HDL Cholesterol (Calc): 195 mg/dL (calc) — ABNORMAL HIGH (ref <130)
Total CHOL/HDL Ratio: 6.9 (calc) — ABNORMAL HIGH (ref <5.0)
Triglycerides: 222 mg/dL — ABNORMAL HIGH (ref <150)

## 2019-05-25 LAB — VITAMIN D 25 HYDROXY (VIT D DEFICIENCY, FRACTURES): Vit D, 25-Hydroxy: 29 ng/mL — ABNORMAL LOW (ref 30–100)

## 2019-05-25 LAB — TSH: TSH: 7.37 mIU/L — ABNORMAL HIGH (ref 0.40–4.50)

## 2019-05-25 MED ORDER — LEVOTHYROXINE SODIUM 125 MCG PO TABS: 125.0000 ug | ORAL_TABLET | Freq: Every day | ORAL | 1 refills | Status: DC

## 2019-05-25 MED ORDER — ROSUVASTATIN CALCIUM 10 MG PO TABS: 10.0000 mg | ORAL_TABLET | Freq: Every day | ORAL | 1 refills | Status: DC

## 2019-05-25 NOTE — Addendum Note (Signed)
Addended by: Gregor Hams on: 05/25/2019 07:29 AM   Modules accepted: Orders

## 2019-06-07 DIAGNOSIS — L930 Discoid lupus erythematosus: Secondary | ICD-10-CM | POA: Diagnosis not present

## 2019-07-25 ENCOUNTER — Telehealth: Payer: Self-pay | Admitting: Sports Medicine

## 2019-07-25 DIAGNOSIS — R7401 Elevation of levels of liver transaminase levels: Secondary | ICD-10-CM

## 2019-07-25 DIAGNOSIS — Z5181 Encounter for therapeutic drug level monitoring: Secondary | ICD-10-CM

## 2019-07-25 DIAGNOSIS — E559 Vitamin D deficiency, unspecified: Secondary | ICD-10-CM

## 2019-07-25 DIAGNOSIS — E785 Hyperlipidemia, unspecified: Secondary | ICD-10-CM

## 2019-07-25 DIAGNOSIS — E038 Other specified hypothyroidism: Secondary | ICD-10-CM

## 2019-07-25 NOTE — Telephone Encounter (Signed)
I see that this was a former Dr. Georgina Snell patient. Is this ok to place labs and have the patient follow up with you until the new provider comes on board. Please advise.

## 2019-07-25 NOTE — Telephone Encounter (Signed)
Yes, since I am covering Dr. Georgina Snell today.

## 2019-07-25 NOTE — Telephone Encounter (Signed)
-----   Message from Gregor Hams, MD sent at 05/25/2019  7:24 AM EDT ----- Regarding: Order follow up labs Patient is due for lab follow-up.  Please order the following labs to follow-up changing levothyroxine dose, vitamin D, and starting rosuvastatin, and transaminitis.  CMP Lipid Panel TSH Vit D  Use the appropriate diagnosis codes on problem list including transaminitis, dyslipidemia, hypothyroidism, therapeutic drug monitoring, and vitamin D deficiency.   Send reminder to patient to get labs done fasting after they have been ordered.  Thank you,  Austin Miranda

## 2019-07-26 NOTE — Telephone Encounter (Signed)
Left message for patient to call back about labs and follow up with Dr. Dianah Field.

## 2019-10-01 ENCOUNTER — Other Ambulatory Visit: Payer: Self-pay | Admitting: Family Medicine

## 2019-10-16 DIAGNOSIS — Z20828 Contact with and (suspected) exposure to other viral communicable diseases: Secondary | ICD-10-CM | POA: Diagnosis not present

## 2019-10-16 DIAGNOSIS — Z03818 Encounter for observation for suspected exposure to other biological agents ruled out: Secondary | ICD-10-CM | POA: Diagnosis not present

## 2019-11-03 ENCOUNTER — Other Ambulatory Visit: Payer: Self-pay | Admitting: Family Medicine

## 2019-11-03 DIAGNOSIS — E038 Other specified hypothyroidism: Secondary | ICD-10-CM

## 2019-11-05 NOTE — Telephone Encounter (Signed)
Must make appointment and labs 

## 2019-12-25 ENCOUNTER — Other Ambulatory Visit: Payer: Self-pay | Admitting: Family Medicine

## 2019-12-25 ENCOUNTER — Other Ambulatory Visit: Payer: Self-pay | Admitting: Physician Assistant

## 2019-12-25 DIAGNOSIS — E038 Other specified hypothyroidism: Secondary | ICD-10-CM

## 2019-12-25 NOTE — Telephone Encounter (Signed)
Levothyroxine #15 sent.   Needs to establish with new PCP

## 2019-12-25 NOTE — Telephone Encounter (Signed)
Attempted to call PT

## 2019-12-25 NOTE — Telephone Encounter (Signed)
CE-Plz see refill req/thx dmf

## 2020-01-22 ENCOUNTER — Ambulatory Visit (INDEPENDENT_AMBULATORY_CARE_PROVIDER_SITE_OTHER): Payer: BC Managed Care – PPO | Admitting: Family Medicine

## 2020-01-22 ENCOUNTER — Encounter: Payer: Self-pay | Admitting: Family Medicine

## 2020-01-22 VITALS — BP 124/95 | HR 78 | Ht 68.9 in | Wt 171.2 lb

## 2020-01-22 DIAGNOSIS — E038 Other specified hypothyroidism: Secondary | ICD-10-CM | POA: Diagnosis not present

## 2020-01-22 DIAGNOSIS — E785 Hyperlipidemia, unspecified: Secondary | ICD-10-CM

## 2020-01-22 MED ORDER — LEVOTHYROXINE SODIUM 125 MCG PO TABS: ORAL_TABLET | ORAL | 1 refills | Status: DC

## 2020-01-22 NOTE — Assessment & Plan Note (Signed)
He does not want to start medication for this.  Recommend we check updated lipid profile, he will have this completed through outside lab and upload to mychart.

## 2020-01-22 NOTE — Patient Instructions (Addendum)
Great to meet you today! Recommended labs:  TSH CMP CBC Lipid Panel Vitamin D  Please upload results through mychart or fax over.

## 2020-01-22 NOTE — Assessment & Plan Note (Signed)
Doing well with current dosing of levothyroxine.  He will have updated TSH drawn at outside lab and have results uploaded through mychart.

## 2020-01-22 NOTE — Progress Notes (Signed)
Austin Miranda - 34 y.o. male MRN [id number]  Date of birth: 27-Sep-1985  Subjective Chief Complaint  Patient presents with  . Establish Care    HPI Austin Miranda is a 34 y.o. male with history of hypothyroidism and HLD here today for follow up visit.  He reports that overall he is doing well.  He is compliant with current dose of levothyroxine.  Denies symptoms related to hypo/hyper-thyroidism.   He was prescribed rosuvastatin as well for HLD however he never started this.  He would prefer to lower this through diet and exercise.   He would like to have labs completed through outside lab due to cost as he has a high deductible insurance plan.    ROS:  A comprehensive ROS was completed and negative except as noted per HPI  No Known Allergies  Past Medical History:  Diagnosis Date  . Asthma, chronic 04/02/2015  . 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 Dyslipidemia 03/17/2016  . Hypothyroidism 04/02/2015    History reviewed. No pertinent surgical history.  Social History   Socioeconomic History  . Marital status: Married    Spouse name: Not on file  . Number of children: Not on file  . Years of education: Not on file  . Highest education level: Not on file  Occupational History  . Not on file  Tobacco Use  . Smoking status: Former Games developer  . Smokeless tobacco: Never Used  Substance and Sexual Activity  . Alcohol use: No    Alcohol/week: 0.0 standard drinks  . Drug use: No  . Sexual activity: Yes  Other Topics Concern  . Not on file  Social History Narrative  . Not on file   Social Determinants of Health   Financial Resource Strain:   . Difficulty of Paying Living Expenses:   Food Insecurity:   . Worried About Programme researcher, broadcasting/film/video in the Last Year:   . Barista in the Last Year:   Transportation Needs:   . Freight forwarder (Medical):   Marland Kitchen Lack of Transportation (Non-Medical):   Physical Activity:   . Days of Exercise per Week:   . Minutes of  Exercise per Session:   Stress:   . Feeling of Stress :   Social Connections:   . Frequency of Communication with Friends and Family:   . Frequency of Social Gatherings with Friends and Family:   . Attends Religious Services:   . Active Member of Clubs or Organizations:   . Attends Banker Meetings:   Marland Kitchen Marital Status:     Family History  Problem Relation Age of Onset  . Hypertension Mother   . Diabetes Mother     Health Maintenance  Topic Date Due  . Hepatitis C Screening  Never done  . COVID-19 Vaccine (1) Never done  . INFLUENZA VACCINE  03/16/2020  . TETANUS/TDAP  03/17/2026  . HIV Screening  Completed     ----------------------------------------------------------------------------------------------------------------------------------------------------------------------------------------------------------------- Physical Exam BP (!) 124/95 (BP Location: Left Arm, Patient Position: Sitting, Cuff Size: Small)   Pulse 78   Ht 5' 8.9" (1.75 m)   Wt 171 lb 3.2 oz (77.7 kg)   SpO2 98%   BMI 25.36 kg/m   Physical Exam Constitutional:      Appearance: Normal appearance.  HENT:     Head: Normocephalic and atraumatic.     Mouth/Throat:     Mouth: Mucous membranes are moist.  Eyes:     General: No scleral icterus. Cardiovascular:  Rate and Rhythm: Normal rate and regular rhythm.  Pulmonary:     Effort: Pulmonary effort is normal.     Breath sounds: Normal breath sounds.  Musculoskeletal:     Cervical back: Neck supple.  Skin:    General: Skin is warm and dry.  Neurological:     General: No focal deficit present.     Mental Status: He is alert.  Psychiatric:        Mood and Affect: Mood normal.        Behavior: Behavior normal.      ------------------------------------------------------------------------------------------------------------------------------------------------------------------------------------------------------------------- Assessment and Plan  Hypothyroidism Doing well with current dosing of levothyroxine.  He will have updated TSH drawn at outside lab and have results uploaded through mychart.   Dyslipidemia He does not want to start medication for this.  Recommend we check updated lipid profile, he will have this completed through outside lab and upload to mychart.    Meds ordered this encounter  Medications  . levothyroxine (SYNTHROID) 125 MCG tablet    Sig: TAKE 1 TABLET DAILY    Dispense:  90 tablet    Refill:  1    Return in about 6 months (around 07/23/2020) for Hypothyroidism.    This visit occurred during the SARS-CoV-2 public health emergency.  Safety protocols were in place, including screening questions prior to the visit, additional usage of staff PPE, and extensive cleaning of exam room while observing appropriate contact time as indicated for disinfecting solutions.

## 2020-01-28 LAB — COMPREHENSIVE METABOLIC PANEL
Albumin: 4.2 (ref 3.5–5.0)
Calcium: 9.2 (ref 8.7–10.7)
GFR calc Af Amer: 110
GFR calc non Af Amer: 95
Globulin: 2.3

## 2020-01-28 LAB — IRON,TIBC AND FERRITIN PANEL
Ferritin: 33.8
Iron: 28.3

## 2020-01-28 LAB — BASIC METABOLIC PANEL
BUN: 7 (ref 4–21)
CO2: 27 — AB (ref 13–22)
Chloride: 104 (ref 99–108)
Creatinine: 1 (ref 0.6–1.3)
Glucose: 91
Potassium: 4.7 (ref 3.4–5.3)
Sodium: 138 (ref 137–147)

## 2020-01-28 LAB — TSH: TSH: 5.32 (ref 0.41–5.90)

## 2020-01-28 LAB — HEPATIC FUNCTION PANEL
Alkaline Phosphatase: 68 (ref 25–125)
Bilirubin, Total: 0.5

## 2020-01-28 LAB — LIPID PANEL
Cholesterol: 209 — AB (ref 0–200)
HDL: 32 — AB (ref 35–70)
LDL Cholesterol: 133
Triglycerides: 285 — AB (ref 40–160)

## 2020-01-28 LAB — CBC AND DIFFERENTIAL
HCT: 47 (ref 41–53)
Hemoglobin: 15.9 (ref 13.5–17.5)
Neutrophils Absolute: 1381
Platelets: 321 (ref 150–399)
WBC: 3.9

## 2020-01-28 LAB — VITAMIN D 25 HYDROXY (VIT D DEFICIENCY, FRACTURES): Vit D, 25-Hydroxy: 27

## 2020-01-28 LAB — CBC: RBC: 5.62 — AB (ref 3.87–5.11)

## 2020-01-29 ENCOUNTER — Encounter: Payer: Self-pay | Admitting: Family Medicine

## 2020-01-31 ENCOUNTER — Encounter: Payer: Self-pay | Admitting: Family Medicine

## 2020-05-26 ENCOUNTER — Encounter: Payer: Self-pay | Admitting: Family Medicine

## 2020-05-26 ENCOUNTER — Ambulatory Visit (INDEPENDENT_AMBULATORY_CARE_PROVIDER_SITE_OTHER): Payer: BC Managed Care – PPO | Admitting: Family Medicine

## 2020-05-26 VITALS — BP 123/80 | HR 58 | Temp 97.9°F | Ht 69.0 in | Wt 171.0 lb

## 2020-05-26 DIAGNOSIS — E038 Other specified hypothyroidism: Secondary | ICD-10-CM | POA: Diagnosis not present

## 2020-05-26 DIAGNOSIS — J4520 Mild intermittent asthma, uncomplicated: Secondary | ICD-10-CM | POA: Diagnosis not present

## 2020-05-26 DIAGNOSIS — Z23 Encounter for immunization: Secondary | ICD-10-CM

## 2020-05-26 DIAGNOSIS — Z0000 Encounter for general adult medical examination without abnormal findings: Secondary | ICD-10-CM | POA: Diagnosis not present

## 2020-05-26 MED ORDER — MONTELUKAST SODIUM 10 MG PO TABS: 10.0000 mg | ORAL_TABLET | Freq: Every day | ORAL | 3 refills | Status: DC

## 2020-05-26 NOTE — Assessment & Plan Note (Signed)
Well adult Labs from 01/2020 reviewed with him.  Immunizations: Flu vaccine Screenings: UTD Anticipatory guidance/Risk factor reduction:  Recommendations per AVS.

## 2020-05-26 NOTE — Progress Notes (Signed)
Austin Miranda - 34 y.o. male MRN [id number]  Date of birth: 11-19-1985  Subjective Chief Complaint  Patient presents with  . Annual Exam    HPI Austin Miranda is a 34 y.o. male here today for annual exam.  He has history of asthma and allergies, hypothyroidism, and elevated triglycerides.    Recent TSH wnl.  Denies symptoms related to thyroid at this time.   TG elevated.  He is taking fish oil and follows a vegetarian diet.    Some increased asthma symptoms, worse when exercising.    He is a non-smoker. He does not consume EtOH.   He is exercising several days per week.   Review of Systems  Constitutional: Negative for chills, fever, malaise/fatigue and weight loss.  HENT: Negative for congestion, ear pain and sore throat.   Eyes: Negative for blurred vision, double vision and pain.  Respiratory: Negative for cough and shortness of breath.   Cardiovascular: Negative for chest pain and palpitations.  Gastrointestinal: Negative for abdominal pain, blood in stool, constipation, heartburn and nausea.  Genitourinary: Negative for dysuria and urgency.  Musculoskeletal: Negative for joint pain and myalgias.  Neurological: Negative for dizziness and headaches.  Endo/Heme/Allergies: Does not bruise/bleed easily.  Psychiatric/Behavioral: Negative for depression. The patient is not nervous/anxious and does not have insomnia.     No Known Allergies  Past Medical History:  Diagnosis Date  . Asthma, chronic 04/02/2015  . 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 Dyslipidemia 03/17/2016  . Hypothyroidism 04/02/2015    History reviewed. No pertinent surgical history.  Social History   Socioeconomic History  . Marital status: Married    Spouse name: Not on file  . Number of children: Not on file  . Years of education: Not on file  . Highest education level: Not on file  Occupational History  . Not on file  Tobacco Use  . Smoking status: Former Games developer  . Smokeless tobacco: Never  Used  Substance and Sexual Activity  . Alcohol use: No    Alcohol/week: 0.0 standard drinks  . Drug use: No  . Sexual activity: Yes  Other Topics Concern  . Not on file  Social History Narrative  . Not on file   Social Determinants of Health   Financial Resource Strain:   . Difficulty of Paying Living Expenses: Not on file  Food Insecurity:   . Worried About Programme researcher, broadcasting/film/video in the Last Year: Not on file  . Ran Out of Food in the Last Year: Not on file  Transportation Needs:   . Lack of Transportation (Medical): Not on file  . Lack of Transportation (Non-Medical): Not on file  Physical Activity:   . Days of Exercise per Week: Not on file  . Minutes of Exercise per Session: Not on file  Stress:   . Feeling of Stress : Not on file  Social Connections:   . Frequency of Communication with Friends and Family: Not on file  . Frequency of Social Gatherings with Friends and Family: Not on file  . Attends Religious Services: Not on file  . Active Member of Clubs or Organizations: Not on file  . Attends Banker Meetings: Not on file  . Marital Status: Not on file    Family History  Problem Relation Age of Onset  . Hypertension Mother   . Diabetes Mother     Health Maintenance  Topic Date Due  . Hepatitis C Screening  Never done  . TETANUS/TDAP  03/17/2026  .  INFLUENZA VACCINE  Completed  . COVID-19 Vaccine  Completed  . HIV Screening  Completed     ----------------------------------------------------------------------------------------------------------------------------------------------------------------------------------------------------------------- Physical Exam BP 123/80 (BP Location: Left Arm, Patient Position: Sitting, Cuff Size: Normal)   Pulse (!) 58   Temp 97.9 F (36.6 C)   Ht 5\' 9"  (1.753 m)   Wt 171 lb (77.6 kg)   SpO2 99%   BMI 25.25 kg/m   Physical Exam Constitutional:      General: He is not in acute distress. HENT:     Head:  Normocephalic and atraumatic.     Right Ear: External ear normal.     Left Ear: External ear normal.  Eyes:     General: No scleral icterus. Neck:     Thyroid: No thyromegaly.  Cardiovascular:     Rate and Rhythm: Normal rate and regular rhythm.     Heart sounds: Normal heart sounds.  Pulmonary:     Effort: Pulmonary effort is normal.     Breath sounds: Normal breath sounds.  Abdominal:     General: Bowel sounds are normal. There is no distension.     Palpations: Abdomen is soft.     Tenderness: There is no abdominal tenderness. There is no guarding.  Musculoskeletal:     Cervical back: Normal range of motion.  Lymphadenopathy:     Cervical: No cervical adenopathy.  Skin:    General: Skin is warm and dry.     Findings: No rash.  Neurological:     Mental Status: He is alert and oriented to person, place, and time.     Cranial Nerves: No cranial nerve deficit.     Motor: No abnormal muscle tone.  Psychiatric:        Mood and Affect: Mood normal.        Behavior: Behavior normal.     ------------------------------------------------------------------------------------------------------------------------------------------------------------------------------------------------------------------- Assessment and Plan  Asthma, chronic Increased allergy and asthma symptoms, worse seasonally and with exercise.  Will add singulair daily.  Continue albuterol as needed.   Hypothyroidism TSH stable on last labs.   Well adult exam Well adult Labs from 01/2020 reviewed with him.  Immunizations: Flu vaccine Screenings: UTD Anticipatory guidance/Risk factor reduction:  Recommendations per AVS.     Meds ordered this encounter  Medications  . montelukast (SINGULAIR) 10 MG tablet    Sig: Take 1 tablet (10 mg total) by mouth at bedtime.    Dispense:  90 tablet    Refill:  3    No follow-ups on file.    This visit occurred during the SARS-CoV-2 public health emergency.  Safety  protocols were in place, including screening questions prior to the visit, additional usage of staff PPE, and extensive cleaning of exam room while observing appropriate contact time as indicated for disinfecting solutions.

## 2020-05-26 NOTE — Patient Instructions (Signed)
Preventive Care 19-36 Years Old, Male Preventive care refers to lifestyle choices and visits with your health care provider that can promote health and wellness. This includes:  A yearly physical exam. This is also called an annual well check.  Regular dental and eye exams.  Immunizations.  Screening for certain conditions.  Healthy lifestyle choices, such as eating a healthy diet, getting regular exercise, not using drugs or products that contain nicotine and tobacco, and limiting alcohol use. What can I expect for my preventive care visit? Physical exam Your health care provider will check:  Height and weight. These may be used to calculate body mass index (BMI), which is a measurement that tells if you are at a healthy weight.  Heart rate and blood pressure.  Your skin for abnormal spots. Counseling Your health care provider may ask you questions about:  Alcohol, tobacco, and drug use.  Emotional well-being.  Home and relationship well-being.  Sexual activity.  Eating habits.  Work and work Statistician. What immunizations do I need?  Influenza (flu) vaccine  This is recommended every year. Tetanus, diphtheria, and pertussis (Tdap) vaccine  You may need a Td booster every 10 years. Varicella (chickenpox) vaccine  You may need this vaccine if you have not already been vaccinated. Human papillomavirus (HPV) vaccine  If recommended by your health care provider, you may need three doses over 6 months. Measles, mumps, and rubella (MMR) vaccine  You may need at least one dose of MMR. You may also need a second dose. Meningococcal conjugate (MenACWY) vaccine  One dose is recommended if you are 45-76 years old and a Market researcher living in a residence hall, or if you have one of several medical conditions. You may also need additional booster doses. Pneumococcal conjugate (PCV13) vaccine  You may need this if you have certain conditions and were not  previously vaccinated. Pneumococcal polysaccharide (PPSV23) vaccine  You may need one or two doses if you smoke cigarettes or if you have certain conditions. Hepatitis A vaccine  You may need this if you have certain conditions or if you travel or work in places where you may be exposed to hepatitis A. Hepatitis B vaccine  You may need this if you have certain conditions or if you travel or work in places where you may be exposed to hepatitis B. Haemophilus influenzae type b (Hib) vaccine  You may need this if you have certain risk factors. You may receive vaccines as individual doses or as more than one vaccine together in one shot (combination vaccines). Talk with your health care provider about the risks and benefits of combination vaccines. What tests do I need? Blood tests  Lipid and cholesterol levels. These may be checked every 5 years starting at age 17.  Hepatitis C test.  Hepatitis B test. Screening   Diabetes screening. This is done by checking your blood sugar (glucose) after you have not eaten for a while (fasting).  Sexually transmitted disease (STD) testing. Talk with your health care provider about your test results, treatment options, and if necessary, the need for more tests. Follow these instructions at home: Eating and drinking   Eat a diet that includes fresh fruits and vegetables, whole grains, lean protein, and low-fat dairy products.  Take vitamin and mineral supplements as recommended by your health care provider.  Do not drink alcohol if your health care provider tells you not to drink.  If you drink alcohol: ? Limit how much you have to 0-2  drinks a day. ? Be aware of how much alcohol is in your drink. In the [place] one drink equals one 12 oz bottle of beer (355 mL), one 5 oz glass of wine (148 mL), or one 1 oz glass of hard liquor (44 mL). Lifestyle  Take daily care of your teeth and gums.  Stay active. Exercise for at least 30 minutes on 5 or  more days each week.  Do not use any products that contain nicotine or tobacco, such as cigarettes, e-cigarettes, and chewing tobacco. If you need help quitting, ask your health care provider.  If you are sexually active, practice safe sex. Use a condom or other form of protection to prevent STIs (sexually transmitted infections). What's next?  Go to your health care provider once a year for a well check visit.  Ask your health care provider how often you should have your eyes and teeth checked.  Stay up to date on all vaccines. This information is not intended to replace advice given to you by your health care provider. Make sure you discuss any questions you have with your health care provider. Document Revised: 07/27/2018 Document Reviewed: 07/27/2018 Elsevier Patient Education  2020 [employer].

## 2020-05-26 NOTE — Assessment & Plan Note (Signed)
Increased allergy and asthma symptoms, worse seasonally and with exercise.  Will add singulair daily.  Continue albuterol as needed.

## 2020-05-26 NOTE — Assessment & Plan Note (Signed)
TSH stable on last labs.

## 2020-07-07 ENCOUNTER — Other Ambulatory Visit: Payer: Self-pay | Admitting: Family Medicine

## 2020-07-07 DIAGNOSIS — E038 Other specified hypothyroidism: Secondary | ICD-10-CM

## 2020-07-08 DIAGNOSIS — M50322 Other cervical disc degeneration at C5-C6 level: Secondary | ICD-10-CM | POA: Diagnosis not present

## 2020-07-08 DIAGNOSIS — M47812 Spondylosis without myelopathy or radiculopathy, cervical region: Secondary | ICD-10-CM | POA: Diagnosis not present

## 2020-07-08 DIAGNOSIS — M5137 Other intervertebral disc degeneration, lumbosacral region: Secondary | ICD-10-CM | POA: Diagnosis not present

## 2020-07-08 DIAGNOSIS — M47817 Spondylosis without myelopathy or radiculopathy, lumbosacral region: Secondary | ICD-10-CM | POA: Diagnosis not present

## 2020-07-09 DIAGNOSIS — M47812 Spondylosis without myelopathy or radiculopathy, cervical region: Secondary | ICD-10-CM | POA: Diagnosis not present

## 2020-07-09 DIAGNOSIS — M47817 Spondylosis without myelopathy or radiculopathy, lumbosacral region: Secondary | ICD-10-CM | POA: Diagnosis not present

## 2020-07-09 DIAGNOSIS — M5137 Other intervertebral disc degeneration, lumbosacral region: Secondary | ICD-10-CM | POA: Diagnosis not present

## 2020-07-09 DIAGNOSIS — M50322 Other cervical disc degeneration at C5-C6 level: Secondary | ICD-10-CM | POA: Diagnosis not present

## 2021-03-02 ENCOUNTER — Other Ambulatory Visit: Payer: Self-pay | Admitting: Family Medicine

## 2021-03-03 ENCOUNTER — Encounter: Payer: Self-pay | Admitting: Medical-Surgical

## 2021-03-03 ENCOUNTER — Other Ambulatory Visit: Payer: Self-pay

## 2021-03-03 ENCOUNTER — Ambulatory Visit (INDEPENDENT_AMBULATORY_CARE_PROVIDER_SITE_OTHER): Payer: BC Managed Care – PPO | Admitting: Medical-Surgical

## 2021-03-03 VITALS — BP 100/65 | HR 81 | Temp 98.5°F | Ht 69.0 in | Wt 170.3 lb

## 2021-03-03 DIAGNOSIS — L509 Urticaria, unspecified: Secondary | ICD-10-CM

## 2021-03-03 MED ORDER — FAMOTIDINE 20 MG PO TABS: 20.0000 mg | ORAL_TABLET | Freq: Two times a day (BID) | ORAL | 0 refills | Status: DC

## 2021-03-03 MED ORDER — LEVOCETIRIZINE DIHYDROCHLORIDE 5 MG PO TABS: 5.0000 mg | ORAL_TABLET | Freq: Every evening | ORAL | 1 refills | Status: AC

## 2021-03-03 MED ORDER — EPINEPHRINE 0.3 MG/0.3ML IJ SOAJ: 0.3000 mg | Freq: Once | INTRAMUSCULAR | 0 refills | Status: AC

## 2021-03-03 MED ORDER — PREDNISONE 10 MG (48) PO TBPK: ORAL_TABLET | Freq: Every day | ORAL | 0 refills | Status: DC

## 2021-03-03 NOTE — Patient Instructions (Signed)
Hives Hives (urticaria) are itchy, red, swollen areas on the skin. Hives can appear on any part of the body. Hives often fade within 24 hours (acute hives). Sometimes, new hives appear after old ones fade and the cycle can continue for several days or weeks (chronic hives). Hives do not spread from person to person (are not contagious). Hives come from the body's reaction to something a person is allergic to (allergen), something that causes irritation, or various other triggers. When a person is exposed to a trigger, his or her body releases a chemical (histamine) that causes redness, itching, and swelling. Hives can appear right afterexposure to a trigger or hours later. What are the causes? This condition may be caused by: Allergies to foods or ingredients. Insect bites or stings. Exposure to pollen or pets. Contact with latex or chemicals. Spending time in sunlight, heat, or cold (exposure). Exercise. Stress. Certain medicines. You can also get hives from other medical conditions and treatments, such as: Viruses, including the common cold. Bacterial infections, such as urinary tract infections and strep throat. Certain medicines. Allergy shots. Blood transfusions. Sometimes, the cause of this condition is not known (idiopathic hives). What increases the risk? You are more likely to develop this condition if you: Are a woman. Have food allergies, especially to citrus fruits, milk, eggs, peanuts, tree nuts, or shellfish. Are allergic to: Medicines. Latex. Insects. Animals. Pollen. What are the signs or symptoms? Common symptoms of this condition include raised, itchy, red or white bumps or patches on your skin. These areas may: Become large and swollen (welts). Change in shape and location, quickly and repeatedly. Be separate hives or connect over a large area of skin. Sting or become painful. Turn white when pressed in the center (blanch). In severe cases, your hands, feet,  and face may also become swollen. This mayoccur if hives develop deeper in your skin. How is this diagnosed? This condition may be diagnosed by your symptoms, medical history, and physical exam. Your skin, urine, or blood may be tested to find out what is causing your hives and to rule out other health issues. Your health care provider may also remove a small sample of skin from the affected area and examine it under a microscope (biopsy). How is this treated? Treatment for this condition depends on the cause and severity of your symptoms. Your health care provider may recommend using cool, wet cloths (cool compresses) or taking cool showers to relieve itching. Treatment may include: Medicines that help: Relieve itching (antihistamines). Reduce swelling (corticosteroids). Treat infection (antibiotics). An injectable medicine (omalizumab). Your health care provider may prescribe this if you have chronic idiopathic hives and you continue to have symptoms even after treatment with antihistamines. Severe cases may require an emergency injection of adrenaline (epinephrine) to prevent a life-threatening allergic reaction (anaphylaxis). Follow these instructions at home: Medicines Take and apply over-the-counter and prescription medicines only as told by your health care provider. If you were prescribed an antibiotic medicine, take it as told by your health care provider. Do not stop using the antibiotic even if you start to feel better. Skin care Apply cool compresses to the affected areas. Do not scratch or rub your skin. General instructions Do not take hot showers or baths. This can make itching worse. Do not wear tight-fitting clothing. Use sunscreen and wear protective clothing when you are outside. Avoid any substances that cause your hives. Keep a journal to help track what causes your hives. Write down: What medicines you take.  What you eat and drink. What products you use on your  skin. Keep all follow-up visits as told by your health care provider. This is important. Contact a health care provider if: Your symptoms are not controlled with medicine. Your joints are painful or swollen. Get help right away if: You have a fever. You have pain in your abdomen. Your tongue or lips are swollen. Your eyelids are swollen. Your chest or throat feels tight. You have trouble breathing or swallowing. These symptoms may represent a serious problem that is an emergency. Do not wait to see if the symptoms will go away. Get medical help right away. Call your local emergency services (911 in the [place] Do not drive yourself to the hospital. Summary Hives (urticaria) are itchy, red, swollen areas on your skin. Hives come from the body's reaction to something a person is allergic to (allergen), something that causes irritation, or various other triggers. Treatment for this condition depends on the cause and severity of your symptoms. Avoid any substances that cause your hives. Keep a journal to help track what causes your hives. Take and apply over-the-counter and prescription medicines only as told by your health care provider. Keep all follow-up visits as told by your health care provider. This is important. This information is not intended to replace advice given to you by your health care provider. Make sure you discuss any questions you have with your healthcare provider. Document Revised: 02/15/2018 Document Reviewed: 02/15/2018 Elsevier Patient Education  2022 [employer]  

## 2021-03-03 NOTE — Progress Notes (Signed)
  HPI with pertinent ROS:   CC: Hives  HPI: For pleasant 35 year old male presenting today for evaluation of hives.  Notes that he has had intermittent hives appearing on a daily basis for the last week.  Had a similar issue in July approximately 8 years ago.  At that time he was evaluated by allergy and found to be allergic to pollens and various environmental things.  They were unable to find a cause for his hives but they went away shortly after.  He did allergy shots for a while but has not done this recently.  He no longer has a relationship with his allergist since he has not been back in many years.  He has had no changes in environmental aspects, medications, foods, cosmetics, materials, etc.  They do not have any pets at home.  He has tried taking cetirizine but this causes him to be fatigued and sedated.  Did try loratadine but this did not have much of an effect.  He has Singulair at home but has not taken this.  Has not tried Allegra, or Xyzal.  Lesions are very itchy when they first start and he has noticed some itching in his mouth and throat.  Would like to have an EpiPen on hand should the itching return to swelling and or respiratory compromise.  I reviewed the past medical history, family history, social history, surgical history, and allergies today and no changes were needed.  Please see the problem list section below in epic for further details.   Physical exam:   General: Well Developed, well nourished, and in no acute distress.  Neuro: Alert and oriented x3.  HEENT: Normocephalic, atraumatic.  Skin: Warm and dry.  Scattered raised hives over all 4 extremities. Cardiac: Regular rate and rhythm, no murmurs rubs or gallops, no lower extremity edema.  Respiratory: Clear to auscultation bilaterally. Not using accessory muscles, speaking in full sentences.  Impression and Recommendations:    1. Urticaria Prednisone 12-day taper pack.  Start famotidine 20 mg twice daily.  Switch  to levocetirizine 5 mg daily.  If this is tolerated well without excessive fatigue or sedation, increase to 5 mg twice daily over the next 2 weeks.  Sending an EpiPen at patient request.  Referring to allergy for repeat testing and evaluation as new allergies may have occurred over the years.  Recommend starting Singulair 10 mg nightly as previously ordered. - predniSONE (STERAPRED UNI-PAK 48 TAB) 10 MG (48) TBPK tablet; Take by mouth daily. 12-Day taper, po  Dispense: 48 tablet; Refill: 0 - famotidine (PEPCID) 20 MG tablet; Take 1 tablet (20 mg total) by mouth 2 (two) times daily.  Dispense: 60 tablet; Refill: 0 - levocetirizine (XYZAL) 5 MG tablet; Take 1 tablet (5 mg total) by mouth every evening.  Dispense: 30 tablet; Refill: 1 - EPINEPHrine 0.3 mg/0.3 mL IJ SOAJ injection; Inject 0.3 mg into the muscle once for 1 dose.  Dispense: 0.3 mL; Refill: 0 - Ambulatory referral to Allergy  Return if symptoms worsen or fail to improve. ___________________________________________ Thayer Ohm, DNP, APRN, FNP-BC Primary Care and Sports Medicine [facility]

## 2021-07-02 ENCOUNTER — Other Ambulatory Visit: Payer: Self-pay | Admitting: Family Medicine

## 2021-07-02 DIAGNOSIS — E038 Other specified hypothyroidism: Secondary | ICD-10-CM

## 2021-07-03 ENCOUNTER — Encounter: Payer: Self-pay | Admitting: Internal Medicine

## 2021-07-03 ENCOUNTER — Other Ambulatory Visit: Payer: Self-pay

## 2021-07-03 ENCOUNTER — Ambulatory Visit (INDEPENDENT_AMBULATORY_CARE_PROVIDER_SITE_OTHER): Payer: BC Managed Care – PPO | Admitting: Internal Medicine

## 2021-07-03 VITALS — BP 132/78 | HR 88 | Temp 98.7°F | Resp 20 | Ht 70.0 in | Wt 172.0 lb

## 2021-07-03 DIAGNOSIS — L501 Idiopathic urticaria: Secondary | ICD-10-CM | POA: Diagnosis not present

## 2021-07-03 DIAGNOSIS — J302 Other seasonal allergic rhinitis: Secondary | ICD-10-CM

## 2021-07-03 DIAGNOSIS — J4550 Severe persistent asthma, uncomplicated: Secondary | ICD-10-CM | POA: Diagnosis not present

## 2021-07-03 MED ORDER — AIRDUO DIGIHALER 113-14 MCG/ACT IN AEPB: 1.0000 | INHALATION_SPRAY | Freq: Two times a day (BID) | RESPIRATORY_TRACT | 4 refills | Status: DC

## 2021-07-03 NOTE — Patient Instructions (Addendum)
Severe Persistent Asthma: uncontrolled - your lung testing today showed severe obstruction with significant reversibility following albuterol - Start AirDuo 113, 1 puff twice a day; THIS SHOULD BE USED EVERY DAY - Rinse mouth out after use  - we will send this to a mail out pharmacy and you should get a discounted price  - this will come with a QR code, download the app and it will give you input on your technique as well as keep track of your use of the medication - Use Albuterol (Proair/Ventolin) 2 puffs every 4-6 hours as needed for chest tightness, wheezing, or coughing - Use Albuterol (Proair/Ventolin) 2 puffs 15 minutes prior to exercise if you have symptoms with activity - Asthma is not controlled if:  - Symptoms are occurring >2 times a week OR  - >2 times a month nighttime awakenings  - Please call the clinic to schedule a follow up if these symptoms arise  Allergic Rhinitis: - allergen avoidance  - Continue Nasal Steroid Spray: Options include Flonase (fluticasone), Nasocort (triamcinolone), Nasonex (mometasome) 1- 2 sprays in each nostril daily (can buy over-the-counter if not covered by insurance)  Best results if used daily during bothersome season (Spring). - Continue over the counter antihistamine daily or daily as needed.   -Your options include Zyrtec (Cetirizine) 10mg , Claritin (Loratadine) 10mg , Allegra (Fexofenadine) 180mg , or Xyzal (Levocetirinze) 5mg   Chronic Idiopathic Urticaria: - this is defined as hives lasting more than 6 weeks without an identifiable trigger - hives can be from a number of different sources including infections, allergies, vibration, temperature, pressure among many others other possible causes - often an identifiable cause is not determined - some potential triggers include: stress, illness, NSAIDs, aspirin, hormonal changes - commonly associated with thyroid disease - approximately 50% of patients with chronic hives can have some associated  swelling of the face/lips/eyelids (this is not a cause for alarm and does not typically progress onto systemic allergic reactions)  Therapy Plan:  - start Xyzal (levocetirizine) 5mg  once daily or once every other day - if hives are uncontrolled, increase Xyzal (levocetirizine) to 5mg   twice daily - if hives remain uncontrolled, increase dose of Xyzal (levocetirizine) 10mg  (2 pills) twice daily- this is maximum dose - can increase or decrease dosing depending on symptom control to a maximum dose of 4 tablets of antihistamine daily. Wait until hives free for at least one month prior to decreasing dose.   Use the least amount of medicine to control your hives. - if hives are still uncontrolled with the above regimen, please arrange an appointment for discussion of Xolair (omalizumab)- an injectable medication for hives  Can use one of the following in place of zyrtec if desires: Claritin (loratadine) 10 mg, Xyzal (levocetirizine) 5 mg or Allegra (fexofenadine) 180 mg daily as needed

## 2021-07-03 NOTE — Progress Notes (Signed)
NEW PATIENT Date of Service/Encounter:  07/03/21 Referring provider: Christen Butter, NP Primary care provider: Everrett Coombe, DO  Subjective:  Austin Miranda is a 35 y.o. male with a PMHx of asthma, hypothyroidism, dyslipidemia, transaminitis, vitamin D deficiency presenting today for evaluation of urticaria. History obtained from: chart review and patient.   Rash: started 7 years ago, but resolved after 4 to 5 months without apparent cause Restarted this July and has been occurring intermittently since then Seems to be controlled with Xyzal 5 mg every other day or somewhere between 4-5 times per week Associates itching, but no angioedema Denies other systemic symptoms including no respiratory, gastrointestinal or cardiovascular distress. No changes to personal care products, detergents, etc Therapies tried: Xyzal Potential triggers: None identified Does not associate fever, joint pain, joint swelling, weight loss.  He does have a history of hypothyroidism and is on levothyroxine. Lesions resolve in 24 hours No bruising on resolution.  Asthma: Diagnosed in early childhood.  Current symptoms include chest tightness, shortness of breath, and wheezing 5-7 daytime symptoms in past month, 0 nighttime awakenings in past month Using rescue inhaler at least 5 times per week Limitations to daily activity: none 0 ED visits, 0 UC visits and 0 oral steroids in the past year 0 number of lifetime hospitalizations, 0 number of lifetime intubations.  Identified Triggers:  large meals and exercise.   He denies reflux, no heartburn, no hoarseness, no globus sensation Previously used therapies: has been on controller inhaler but not in past year.  Current regimen:  Maintenance: none Rescue: Albuterol 2 puffs q4-6 hrs PRN, using prior to exercise  Additionally, he has a history of seasonal allergies and was [facility] and currently [facility].  He did shots for 2 years  and he felt they were helpful.  This was in 2014.    Seen 03/03/2021 for urticaria occurring daily with a similar issue 8 years ago.  Has had allergy testing positive to pollens and other environmental.  Has been on allergy shots in the past.  He was treated with a prednisone 12-day taper pack as well started on famotidine twice daily and switch to levocetirizine daily with addition of Singulair nightly.  He was also provided an EpiPen.  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 1 each 4   levocetirizine (XYZAL) 5 MG tablet Take 1 tablet (5 mg total) by mouth every evening. (Patient taking differently: Take 5 mg by mouth.) 30 tablet 1   levothyroxine (SYNTHROID) 125 MCG tablet TAKE 1 TABLET DAILY 30 tablet 0   Multiple Vitamin (MULTIVITAMIN WITH MINERALS) TABS tablet Take 1 tablet by mouth daily.     Omega-3 Fatty Acids (FISH OIL) 1000 MG CAPS Take 1,000 mg by mouth daily.     famotidine (PEPCID) 20 MG tablet Take 1 tablet (20 mg total) by mouth 2 (two) times daily. (Patient not taking: Reported on 07/03/2021) 60 tablet 0   No current facility-administered  medications for this visit.   Known Allergies:  No Known Allergies Past Surgical History: History reviewed. No pertinent surgical history. Family History: Family History  Problem Relation Age of Onset   Hypertension Mother    Diabetes Mother    Asthma Neg Hx    Allergic rhinitis Neg  Hx    Eczema Neg Hx    Immunodeficiency Neg Hx    Urticaria Neg Hx    Angioedema Neg Hx    Social History: Maalik lives in a house built 12 years ago without water damage, carpet in the bedroom, electric heating, central AC, no pets, no pests, not using dust mite protection, no smoke exposure.  He is a Art gallery manager x9 years.  Not exposed to fumes chemicals or dust at work.  Not near an interstate or industrial area.  He is not a smoker.   ROS:  All other systems negative except as noted per HPI.  Objective:  Blood pressure 132/78, pulse 88, temperature 98.7 F (37.1 C), temperature source Temporal, resp. rate 20, height 5\' 10"  (1.778 m), weight 172 lb (78 kg), SpO2 98 %. Body mass index is 24.68 kg/m. Physical Exam:  General Appearance:  Alert, cooperative, no distress, appears stated age  Head:  Normocephalic, without obvious abnormality, atraumatic  Eyes:  Conjunctiva clear, EOM's intact  Nose: Nares normal, bilateral hypertrophic turbinates with clear scant rhinorrhea  Throat: Lips, tongue normal; teeth and gums normal, normal posterior oropharynx  Neck: Supple, symmetrical  Lungs:   Clear to auscultation bilaterally, respirations unlabored, no coughing  Heart:  Regular rate and rhythm, no murmur appears well perfused  Extremities: No edema  Skin: Skin color, texture, turgor normal, no rashes or lesions on visualized portions of skin  Neurologic: No gross deficits     Diagnostics: Spirometry:  Tracings reviewed. His effort: Good reproducible efforts. FVC: 2.81L (pre), 3.68L  (post)+31% 870L FEV1: 1.70L, 42% predicted (pre), 2.16L, 54% predicted (post) +27% and 460L FEV1/FVC ratio: 73% (pre), 72% (post) Interpretation: Spirometry consistent with severe obstructive disease with significant bronchodilator response Please see scanned spirometry results for details.  Assessment and Plan   Patient Instructions  Severe Persistent Asthma: uncontrolled - your lung testing today  showed severe obstruction with significant reversibility following albuterol - Start AirDuo 113, 1 puff twice a day; THIS SHOULD BE USED EVERY DAY - Rinse mouth out after use  - we will send this to a mail out pharmacy and you should get a discounted price  - this will come with a QR code, download the app and it will give you input on your technique as well as keep track of your use of the medication - Use Albuterol (Proair/Ventolin) 2 puffs every 4-6 hours as needed for chest tightness, wheezing, or coughing - Use Albuterol (Proair/Ventolin) 2 puffs 15 minutes prior to exercise if you have symptoms with activity - Asthma is not controlled if:  - Symptoms are occurring >2 times a week OR  - >2 times a month nighttime awakenings  - Please call the clinic to schedule a follow up if these symptoms arise  Allergic Rhinitis: - allergen avoidance  -Deferred allergy testing as he has had this done in the past in feels his symptoms are controlled-consider retesting in the future if symptoms worsen - Continue Nasal Steroid Spray: Options include Flonase (fluticasone), Nasocort (triamcinolone), Nasonex (mometasome) 1- 2 sprays in each nostril daily (can buy over-the-counter if not covered by insurance)  Best results if used daily during bothersome season (Spring). -  Continue over the counter antihistamine daily or daily as needed.   -Your options include Zyrtec (Cetirizine) 10mg , Claritin (Loratadine) 10mg , Allegra (Fexofenadine) 180mg , or Xyzal (Levocetirinze) 5mg   Chronic Idiopathic Urticaria:  Therapy Plan:  - start Xyzal (levocetirizine) 5mg  once daily or once every other day - if hives are uncontrolled, increase Xyzal (levocetirizine) to 5mg   twice daily - if hives remain uncontrolled, increase dose of Xyzal (levocetirizine) 10mg  (2 pills) twice daily- this is maximum dose - can increase or decrease dosing depending on symptom control to a maximum dose of 4 tablets of antihistamine daily. Wait  until hives free for at least one month prior to decreasing dose.   Use the least amount of medicine to control your hives. - if hives are still uncontrolled with the above regimen, please arrange an appointment for discussion of Xolair (omalizumab)- an injectable medication for hives  Can use one of the following in place of zyrtec if desires: Claritin (loratadine) 10 mg, Xyzal (levocetirizine) 5 mg or Allegra (fexofenadine) 180 mg daily as needed  Follow-up in 4 weeks.  This note in its entirety was forwarded to the Provider who requested this consultation.  Thank you for your kind referral. I appreciate the opportunity to take part in Marquavion's care. Please do not hesitate to contact me with questions.  Sincerely,  , MD [facility]

## 2021-07-16 ENCOUNTER — Encounter: Payer: BC Managed Care – PPO | Admitting: Family Medicine

## 2021-07-31 ENCOUNTER — Ambulatory Visit: Payer: BC Managed Care – PPO | Admitting: Internal Medicine

## 2021-08-04 NOTE — Progress Notes (Signed)
FOLLOW UP Date of Service/Encounter:  08/06/21   Subjective:  Austin Miranda (DOB: 12-Mar-1986) is a 35 y.o. male who [facility] in re-evaluation of the following: Severe persistent asthma History obtained from: chart review and patient.  For Review, LV was on 07/03/2021 with Dr.Marika Miranda seen for severe persistent asthma, chronic hives, and allergic rhinitis [facility]).  He was started on air duo 113, 1 puff twice a day at his visit.  He was continued on nasal steroid spray and an over-the-counter antihistamine.  For his chronic idiopathic hives he was started on Xyzal all once daily or every other day.  His hives have been controlled on that dosing.  Pertinent history/diagnostics: - Spirometry 07/03/2021: Ratio 73%, FEV1 42% predicted, +27% post bronchodilator response (460 L) and FEV1, and 31% improvement in FVC (870 L) -Has been on [facility] in the past [facility]; stopped in 2014  Today presents for follow-up. He continues to use albuterol 2 to 3 times per week which is significantly better than prior.  Plan his last visit, he was using multiple times per day.  He has not been on a controller inhaler in his entire life.  He did not realize how obstructed his lungs were until our last visit. He is very active in sports.  Does triathlons.  He still struggles with shortness of breath with swimming, but does well with running and biking.  He asked about other holistic ways to help manage his asthma.  We discussed that endurance exercises a great support to help keep lung capacity at its maximum.  Additionally eating a well-balanced diet, getting yearly preventative vaccines, and doing other breathing exercises.  Regarding his hives, he does feel that this is starting to burn itself out.  He hasn't taken his allergy medication for at least one week.  No hives in one week.  He can now  recognize when it is going to flare and he will take an antihistamine when he starts to get the feeling of a flare.  Usually antihistamine will kick in within 30 minutes, and he is able to prevent hives.  Allergic rhinitis remains controlled using medications as needed.    Allergies as of 08/06/2021   No Known Allergies      Medication List        Accurate as of August 06, 2021 10:32 AM. If you have any questions, ask your nurse or doctor.          STOP taking these medications    AirDuo Digihaler 113-14 MCG/ACT Aepb Generic drug: Fluticasone-Salmeterol(sensor) Replaced by: AirDuo Digihaler 232-14 MCG/ACT Aepb Stopped by: Austin Bollman, MD       TAKE these medications    AirDuo Digihaler 232-14 MCG/ACT Aepb Generic drug: Fluticasone-Salmeterol(sensor) Inhale 1 puff into the lungs in the morning and at bedtime. Replaces: AirDuo Digihaler 113-14 MCG/ACT Aepb Started by: Austin Bollman, MD   albuterol 108 (90 Base) MCG/ACT inhaler Commonly known as: VENTOLIN HFA USE 2 INHALATIONS EVERY 6 HOURS AS NEEDED FOR WHEEZING   EPINEPHrine 0.3 mg/0.3 mL Soaj injection Commonly known as: EPI-PEN SMARTSIG:0.3 Milligram(s) IM Once   famotidine 20 MG tablet Commonly known as: Pepcid Take 1 tablet (20 mg total) by mouth 2 (two) times daily.   Fish Oil 1000 MG Caps Take 1,000 mg by mouth daily.   levocetirizine 5 MG tablet Commonly known as: XYZAL Take 1 tablet (5 mg  total) by mouth every evening. What changed: when to take this   levothyroxine 125 MCG tablet Commonly known as: SYNTHROID TAKE 1 TABLET DAILY   multivitamin with minerals Tabs tablet Take 1 tablet by mouth daily.       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 Otherwise, there have been no changes to his past medical history, surgical history,  family history, or social history.  ROS: All others negative except as noted per HPI.   Objective:  BP 104/60    Pulse 76    Temp 97.9 F (36.6 C) (Temporal)    Resp (!) 21    SpO2 95%  There is no height or weight on file to calculate BMI. Physical Exam: General Appearance:  Alert, cooperative, no distress, appears stated age  Head:  Normocephalic, without obvious abnormality, atraumatic  Eyes:  Conjunctiva clear, EOM's intact  Nose: Nares normal, hypertrophic turbinates and normal mucosa  Throat: Lips, tongue normal; teeth and gums normal, normal posterior oropharynx and tonsils 2+  Neck: Supple, symmetrical  Lungs:   Prolonged expiratory phase and clear to auscultation bilaterally, Respirations unlabored, no coughing  Heart:  regular rate and rhythm and no murmur, Appears well perfused  Extremities: No edema  Skin: Skin color, texture, turgor normal, no rashes or lesions on visualized portions of skin  Neurologic: No gross deficits  Spirometry:  Tracings reviewed. His effort: Good reproducible efforts. FVC: 3.64L FEV1: 2.37L, 59% predicted FEV1/FVC ratio: 79% Interpretation: Spirometry consistent with moderate obstructive disease.  Please see scanned spirometry results for details.  Assessment/Plan  Austin Miranda has severe obstructive asthma which has been untreated throughout his entire life.  He does not perceive his level of obstruction, but does notice a significant difference since starting a controller inhaler at last visit.   His spirometry has improved significantly with over 10% increase in his FEV1.  Does continue to use his albuterol several times per week usually in the setting of exercise.  Given his still very low FEV1 and multiple times per week use of albuterol, I do think there is room for improvement, so we will increase his controller inhaler to air duo 232. His hives are improving, and he has a good understanding of how to treat them when they do infrequently occur.   His allergic rhinitis is also controlled. Patient Instructions  Severe Persistent Asthma: uncontrolled - your lung testing today looked much better!!! - Start AirDuo 232, 1 puff twice a day; THIS SHOULD BE USED EVERY DAY - Rinse mouth out after use For Asthma flares:  - add AirDuo 113 1 puff twice a day followed by AirDuo 232 1 puff twice a day for at least one week or until symptoms resolve - Use Albuterol (Proair/Ventolin) 2 puffs every 4-6 hours as needed for chest tightness, wheezing, or coughing - Use Albuterol (Proair/Ventolin) 2 puffs 15 minutes prior to exercise if you have symptoms with activity - Asthma is not controlled if:  - Symptoms are occurring >2 times a week OR  - >2 times a month nighttime awakenings  - Please call the clinic to schedule a follow up if these symptoms arise  Allergic Rhinitis: - allergen avoidance  - Continue Nasal Steroid Spray: Options include Flonase (fluticasone), Nasocort (triamcinolone), Nasonex (mometasome) 1- 2 sprays in each nostril daily (can buy over-the-counter if not covered by insurance)  Best results if used daily during bothersome season (Spring). - Continue over the counter antihistamine daily or daily as needed.   -Your options include Zyrtec (Cetirizine) 10mg , Claritin (Loratadine) 10mg , Allegra (Fexofenadine) 180mg , or Xyzal (Levocetirinze) 5mg   Chronic Idiopathic Urticaria: - continue Xyzal (levocetirizine) 5mg  once daily or once every other day - if hives are uncontrolled, increase Xyzal (levocetirizine) to 5mg   twice daily - if hives remain uncontrolled, increase dose of Xyzal (levocetirizine) 10mg  (2 pills) twice daily- this is maximum dose - can increase or decrease dosing depending on symptom control to a maximum dose of 4 tablets of antihistamine daily. Wait until hives free for at least one month prior to decreasing dose.   Use the least amount of medicine to control your hives. - if hives are still uncontrolled with the above  regimen, please arrange an appointment for discussion of Xolair (omalizumab)- an injectable medication for hives  Can use one of the following in place of zyrtec if desires: Claritin (loratadine) 10 mg, Xyzal (levocetirizine) 5 mg or Allegra (fexofenadine) 180 mg daily as needed   Follow-up in 3 to 4 months!  , MD  [facility]

## 2021-08-06 ENCOUNTER — Ambulatory Visit (INDEPENDENT_AMBULATORY_CARE_PROVIDER_SITE_OTHER): Payer: BC Managed Care – PPO | Admitting: Internal Medicine

## 2021-08-06 ENCOUNTER — Encounter: Payer: Self-pay | Admitting: Internal Medicine

## 2021-08-06 ENCOUNTER — Other Ambulatory Visit: Payer: Self-pay

## 2021-08-06 VITALS — BP 104/60 | HR 76 | Temp 97.9°F | Resp 21

## 2021-08-06 DIAGNOSIS — J4550 Severe persistent asthma, uncomplicated: Secondary | ICD-10-CM

## 2021-08-06 DIAGNOSIS — L501 Idiopathic urticaria: Secondary | ICD-10-CM

## 2021-08-06 DIAGNOSIS — J302 Other seasonal allergic rhinitis: Secondary | ICD-10-CM | POA: Diagnosis not present

## 2021-08-06 MED ORDER — AIRDUO DIGIHALER 232-14 MCG/ACT IN AEPB: 1.0000 | INHALATION_SPRAY | Freq: Two times a day (BID) | RESPIRATORY_TRACT | 5 refills | Status: DC

## 2021-08-06 NOTE — Patient Instructions (Addendum)
Severe Persistent Asthma: uncontrolled - your lung testing today looked much better!!! - Start AirDuo 232, 1 puff twice a day; THIS SHOULD BE USED EVERY DAY - Rinse mouth out after use For Asthma flares:  - add AirDuo 113 1 puff twice a day followed by AirDuo 232 1 puff twice a day for at least one week or until symptoms resolve - Use Albuterol (Proair/Ventolin) 2 puffs every 4-6 hours as needed for chest tightness, wheezing, or coughing - Use Albuterol (Proair/Ventolin) 2 puffs 15 minutes prior to exercise if you have symptoms with activity - Asthma is not controlled if:  - Symptoms are occurring >2 times a week OR  - >2 times a month nighttime awakenings  - Please call the clinic to schedule a follow up if these symptoms arise  Allergic Rhinitis: - allergen avoidance  - Continue Nasal Steroid Spray: Options include Flonase (fluticasone), Nasocort (triamcinolone), Nasonex (mometasome) 1- 2 sprays in each nostril daily (can buy over-the-counter if not covered by insurance)  Best results if used daily during bothersome season (Spring). - Continue over the counter antihistamine daily or daily as needed.   -Your options include Zyrtec (Cetirizine) 10mg , Claritin (Loratadine) 10mg , Allegra (Fexofenadine) 180mg , or Xyzal (Levocetirinze) 5mg   Chronic Idiopathic Urticaria: - continue Xyzal (levocetirizine) 5mg  once daily or once every other day - if hives are uncontrolled, increase Xyzal (levocetirizine) to 5mg   twice daily - if hives remain uncontrolled, increase dose of Xyzal (levocetirizine) 10mg  (2 pills) twice daily- this is maximum dose - can increase or decrease dosing depending on symptom control to a maximum dose of 4 tablets of antihistamine daily. Wait until hives free for at least one month prior to decreasing dose.   Use the least amount of medicine to control your hives. - if hives are still uncontrolled with the above regimen, please arrange an appointment for discussion of Xolair  (omalizumab)- an injectable medication for hives  Can use one of the following in place of zyrtec if desires: Claritin (loratadine) 10 mg, Xyzal (levocetirizine) 5 mg or Allegra (fexofenadine) 180 mg daily as needed   Follow-up in 3 to 4 months!  It was a pleasure seeing you again in clinic today! If you receive a survey about today's experience, please consider giving feedback on how we're doing!  , MD [facility]

## 2021-08-12 ENCOUNTER — Telehealth: Payer: Self-pay

## 2021-08-12 NOTE — Telephone Encounter (Signed)
Pa submitted thru cover my meds waiting on results from insurance

## 2021-08-18 NOTE — Telephone Encounter (Signed)
BP- Brand Preferred BN- Brand Non preferred  GP- Generic Preferred  GN- Generic Non preferred

## 2021-08-18 NOTE — Telephone Encounter (Signed)
Denied on August 13, 2021 CaseId:[id number];Status:Denied;Review Type:Prior Auth;Appeal Information: Attention:ATTN: [employer]INICAL APPEALS [employer]PARTMENT [employer] [id number]. [phone] Phone:[phone] Fax:[phone]; Important - Please read the below note on eAppeals: Please reference the denial letter for information on the rights for an appeal, rationale for the denial, and how to submit an appeal including if any information is needed to support the appeal. Note about urgent situations - Generally, an urgent situation is one which, in the opinion of the provider, the health of the patient may be in serious jeopardy or may experience pain that cannot be adequately controlled while waiting for a decision on the appeal.;

## 2021-08-20 ENCOUNTER — Encounter: Payer: Self-pay | Admitting: Internal Medicine

## 2021-08-20 ENCOUNTER — Other Ambulatory Visit: Payer: Self-pay

## 2021-08-20 MED ORDER — AIRDUO DIGIHALER 232-14 MCG/ACT IN AEPB: 1.0000 | INHALATION_SPRAY | Freq: Two times a day (BID) | RESPIRATORY_TRACT | 5 refills | Status: DC

## 2021-08-26 ENCOUNTER — Telehealth: Payer: Self-pay | Admitting: Family Medicine

## 2021-08-26 ENCOUNTER — Other Ambulatory Visit: Payer: Self-pay | Admitting: Family Medicine

## 2021-08-26 DIAGNOSIS — E038 Other specified hypothyroidism: Secondary | ICD-10-CM

## 2021-08-26 NOTE — Telephone Encounter (Signed)
Patient scheduled an appt with Dr. Zigmund Daniel for Sep 29, 2021 but he states he will need his Thyroid Medication before then. Thank you

## 2021-08-27 MED ORDER — LEVOTHYROXINE SODIUM 125 MCG PO TABS: ORAL_TABLET | ORAL | 0 refills | Status: DC

## 2021-08-27 NOTE — Telephone Encounter (Signed)
Completed.

## 2021-08-27 NOTE — Telephone Encounter (Signed)
LVM advising pt of Rx sent to pharmacy.

## 2021-08-30 NOTE — Telephone Encounter (Signed)
Does AirDuo not go through the mail-in pharmacy in [place]?  I think it's only like $25 with commercial insurance? Do we need to choose something else? Or is he getting via [facility] pharmacy?  If we do need to choose something, Breo 200 1 puff daily would be a fine alternative.  Thanks!

## 2021-09-01 NOTE — Telephone Encounter (Signed)
Disregard- [facility] states the pt filled rx for November and December but she will get the AirDuo 232 rx ready with the coupon it is $20. She is going to reach out to the pt for payment.

## 2021-09-07 ENCOUNTER — Ambulatory Visit: Payer: BC Managed Care – PPO | Admitting: Internal Medicine

## 2021-09-29 ENCOUNTER — Ambulatory Visit (INDEPENDENT_AMBULATORY_CARE_PROVIDER_SITE_OTHER): Payer: BC Managed Care – PPO | Admitting: Family Medicine

## 2021-09-29 ENCOUNTER — Encounter: Payer: Self-pay | Admitting: Family Medicine

## 2021-09-29 ENCOUNTER — Other Ambulatory Visit: Payer: Self-pay

## 2021-09-29 VITALS — BP 135/90 | HR 71 | Temp 97.4°F | Ht 69.0 in | Wt 169.5 lb

## 2021-09-29 DIAGNOSIS — E559 Vitamin D deficiency, unspecified: Secondary | ICD-10-CM | POA: Diagnosis not present

## 2021-09-29 DIAGNOSIS — E785 Hyperlipidemia, unspecified: Secondary | ICD-10-CM | POA: Diagnosis not present

## 2021-09-29 DIAGNOSIS — Z0000 Encounter for general adult medical examination without abnormal findings: Secondary | ICD-10-CM | POA: Diagnosis not present

## 2021-09-29 DIAGNOSIS — E038 Other specified hypothyroidism: Secondary | ICD-10-CM | POA: Diagnosis not present

## 2021-09-29 NOTE — Progress Notes (Signed)
Austin Miranda - 36 y.o. male MRN [id number]  Date of birth: 10-06-1985  Subjective Chief Complaint  Patient presents with   Annual Exam    HPI Austin Miranda is a 36 year old male here today for annual exam.  Reports he is doing well at this time.  No new concerns today.  He is exercising regularly and feels like his diet is pretty healthy.  He is a non-smoker.  He rarely consumes alcohol.  Review of Systems  Constitutional:  Negative for chills, fever, malaise/fatigue and weight loss.  HENT:  Negative for congestion, ear pain and sore throat.   Eyes:  Negative for blurred vision, double vision and pain.  Respiratory:  Negative for cough and shortness of breath.   Cardiovascular:  Negative for chest pain and palpitations.  Gastrointestinal:  Negative for abdominal pain, blood in stool, constipation, heartburn and nausea.  Genitourinary:  Negative for dysuria and urgency.  Musculoskeletal:  Negative for joint pain and myalgias.  Neurological:  Negative for dizziness and headaches.  Endo/Heme/Allergies:  Does not bruise/bleed easily.  Psychiatric/Behavioral:  Negative for depression. The patient is not nervous/anxious and does not have insomnia.    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 No pertinent surgical history.  Social History   Socioeconomic History   Marital status: Married    Spouse name: Not on file   Number of children: Not on file   Years of education: Not on file   Highest education level: Not on file  Occupational History   Not on file  Tobacco Use   Smoking status: Former    Years: 4.00    Types: Cigarettes    Quit date: 2010    Years since quitting: 13.1   Smokeless tobacco: Never   Tobacco comments:    8/10 ciig a day  Vaping Use   Vaping Use: Never used  Substance and Sexual Activity    Alcohol use: No    Alcohol/week: 0.0 standard drinks   Drug use: No   Sexual activity: Yes  Other Topics Concern   Not on file  Social History Narrative   Not on file   Social Determinants of Health   Financial Resource Strain: Not on file  Food Insecurity: Not on file  Transportation Needs: Not on file  Physical Activity: Not on file  Stress: Not on file  Social Connections: Not on file    Family History  Problem Relation Age of Onset   Hypertension Mother    Diabetes Mother    Asthma Neg Hx    Allergic rhinitis Neg Hx    Eczema Neg Hx    Immunodeficiency Neg Hx    Urticaria Neg Hx    Angioedema Neg Hx     Health Maintenance  Topic Date Due   Hepatitis C Screening  Never done   TETANUS/TDAP  03/17/2026   INFLUENZA VACCINE  Completed   COVID-19 Vaccine  Completed   HIV Screening  Completed   HPV VACCINES  Aged Out     ----------------------------------------------------------------------------------------------------------------------------------------------------------------------------------------------------------------- Physical Exam BP 135/90 (BP Location: Left Arm, Patient Position: Sitting, Cuff Size: Normal)    Pulse 71    Temp (!) 97.4 F (36.3 C)    Ht 5\' 9"  (1.753 m)    Wt 169 lb 8 oz (76.9 kg)    SpO2 100%    BMI  25.03 kg/m   Physical Exam Constitutional:      General: He is not in acute distress. HENT:     Head: Normocephalic and atraumatic.     Right Ear: Tympanic membrane and external ear normal.     Left Ear: Tympanic membrane and external ear normal.  Eyes:     General: No scleral icterus. Neck:     Thyroid: No thyromegaly.  Cardiovascular:     Rate and Rhythm: Normal rate and regular rhythm.     Heart sounds: Normal heart sounds.  Pulmonary:     Effort: Pulmonary effort is normal.     Breath sounds: Normal breath sounds.  Abdominal:     General: Bowel sounds are normal. There is no distension.     Palpations: Abdomen is soft.      Tenderness: There is no abdominal tenderness. There is no guarding.  Musculoskeletal:     Cervical back: Normal range of motion.  Lymphadenopathy:     Cervical: No cervical adenopathy.  Skin:    General: Skin is warm and dry.     Findings: No rash.  Neurological:     Mental Status: He is alert and oriented to person, place, and time.     Cranial Nerves: No cranial nerve deficit.     Motor: No abnormal muscle tone.  Psychiatric:        Mood and Affect: Mood normal.        Behavior: Behavior normal.    ------------------------------------------------------------------------------------------------------------------------------------------------------------------------------------------------------------------- Assessment and Plan  Well adult exam Well adult Orders Placed This Encounter  Procedures   COMPLETE METABOLIC PANEL WITH GFR   CBC with Differential   Lipid Panel w/reflex Direct LDL   TSH   Vitamin D (25 hydroxy)  Screenings: Per lab orders Immunizations: Up-to-date Anticipatory guidance/risk factor reduction: Recommendations per AVS.   No orders of the defined types were placed in this encounter.   No follow-ups on file.    This visit occurred during the SARS-CoV-2 public health emergency.  Safety protocols were in place, including screening questions prior to the visit, additional usage of staff PPE, and extensive cleaning of exam room while observing appropriate contact time as indicated for disinfecting solutions.

## 2021-09-29 NOTE — Assessment & Plan Note (Signed)
Well adult Orders Placed This Encounter  Procedures  . COMPLETE METABOLIC PANEL WITH GFR  . CBC with Differential  . Lipid Panel w/reflex Direct LDL  . TSH  . Vitamin D (25 hydroxy)  Screenings: Per lab orders Immunizations: Up-to-date Anticipatory guidance/risk factor reduction: Recommendations per AVS. 

## 2021-09-29 NOTE — Patient Instructions (Signed)
Preventive Care 38-39 Years Old, Male ?Preventive care refers to lifestyle choices and visits with your health care provider that can promote health and wellness. Preventive care visits are also called wellness exams. ?What can I expect for my preventive care visit? ?Counseling ?During your preventive care visit, your health care provider may ask about your: ?Medical history, including: ?Past medical problems. ?Family medical history. ?Current health, including: ?Emotional well-being. ?Home life and relationship well-being. ?Sexual activity. ?Lifestyle, including: ?Alcohol, nicotine or tobacco, and drug use. ?Access to firearms. ?Diet, exercise, and sleep habits. ?Safety issues such as seatbelt and bike helmet use. ?Sunscreen use. ?Work and work environment. ?Physical exam ?Your health care provider may check your: ?Height and weight. These may be used to calculate your BMI (body mass index). BMI is a measurement that tells if you are at a healthy weight. ?Waist circumference. This measures the distance around your waistline. This measurement also tells if you are at a healthy weight and may help predict your risk of certain diseases, such as type 2 diabetes and high blood pressure. ?Heart rate and blood pressure. ?Body temperature. ?Skin for abnormal spots. ?What immunizations do I need? ?Vaccines are usually given at various ages, according to a schedule. Your health care provider will recommend vaccines for you based on your age, medical history, and lifestyle or other factors, such as travel or where you work. ?What tests do I need? ?Screening ?Your health care provider may recommend screening tests for certain conditions. This may include: ?Lipid and cholesterol levels. ?Diabetes screening. This is done by checking your blood sugar (glucose) after you have not eaten for a while (fasting). ?Hepatitis B test. ?Hepatitis C test. ?HIV (human immunodeficiency virus) test. ?STI (sexually transmitted infection)  testing, if you are at risk. ?Talk with your health care provider about your test results, treatment options, and if necessary, the need for more tests. ?Follow these instructions at home: ?Eating and drinking ? ?Eat a healthy diet that includes fresh fruits and vegetables, whole grains, lean protein, and low-fat dairy products. ?Drink enough fluid to keep your urine pale yellow. ?Take vitamin and mineral supplements as recommended by your health care provider. ?Do not drink alcohol if your health care provider tells you not to drink. ?If you drink alcohol: ?Limit how much you have to 0-2 drinks a day. ?Know how much alcohol is in your drink. In the [place] one drink equals one 12 oz bottle of beer (355 mL), one 5 oz glass of wine (148 mL), or one 1? oz glass of hard liquor (44 mL). ?Lifestyle ?Brush your teeth every morning and night with fluoride toothpaste. Floss one time each day. ?Exercise for at least 30 minutes 5 or more days each week. ?Do not use any products that contain nicotine or tobacco. These products include cigarettes, chewing tobacco, and vaping devices, such as e-cigarettes. If you need help quitting, ask your health care provider. ?Do not use drugs. ?If you are sexually active, practice safe sex. Use a condom or other form of protection to prevent STIs. ?Find healthy ways to manage stress, such as: ?Meditation, yoga, or listening to music. ?Journaling. ?Talking to a trusted person. ?Spending time with friends and family. ?Minimize exposure to UV radiation to reduce your risk of skin cancer. ?Safety ?Always wear your seat belt while driving or riding in a vehicle. ?Do not drive: ?If you have been drinking alcohol. Do not ride with someone who has been drinking. ?If you have been using any mind-altering substances or   drugs. ?While texting. ?When you are tired or distracted. ?Wear a helmet and other protective equipment during sports activities. ?If you have firearms in your house, make sure you  follow all gun safety procedures. ?Seek help if you have been physically or sexually abused. ?What's next? ?Go to your health care provider once a year for an annual wellness visit. ?Ask your health care provider how often you should have your eyes and teeth checked. ?Stay up to date on all vaccines. ?This information is not intended to replace advice given to you by your health care provider. Make sure you discuss any questions you have with your health care provider. ?Document Revised: 01/28/2021 Document Reviewed: 01/28/2021 ?Elsevier Patient Education ? 2022 Elsevier Inc. ? ?

## 2021-09-30 LAB — CBC WITH DIFFERENTIAL/PLATELET
Absolute Monocytes: 323 cells/uL (ref 200–950)
Basophils Absolute: 22 cells/uL (ref 0–200)
Basophils Relative: 0.5 %
Eosinophils Absolute: 202 cells/uL (ref 15–500)
Eosinophils Relative: 4.7 %
HCT: 48.8 % (ref 38.5–50.0)
Hemoglobin: 16 g/dL (ref 13.2–17.1)
Lymphs Abs: 1488 cells/uL (ref 850–3900)
MCH: 28.6 pg (ref 27.0–33.0)
MCHC: 32.8 g/dL (ref 32.0–36.0)
MCV: 87.1 fL (ref 80.0–100.0)
MPV: 10.7 fL (ref 7.5–12.5)
Monocytes Relative: 7.5 %
Neutro Abs: 2266 cells/uL (ref 1500–7800)
Neutrophils Relative %: 52.7 %
Platelets: 316 10*3/uL (ref 140–400)
RBC: 5.6 10*6/uL (ref 4.20–5.80)
RDW: 13.7 % (ref 11.0–15.0)
Total Lymphocyte: 34.6 %
WBC: 4.3 10*3/uL (ref 3.8–10.8)

## 2021-09-30 LAB — VITAMIN D 25 HYDROXY (VIT D DEFICIENCY, FRACTURES): Vit D, 25-Hydroxy: 39 ng/mL (ref 30–100)

## 2021-09-30 LAB — COMPLETE METABOLIC PANEL WITH GFR
AG Ratio: 1.6 (calc) (ref 1.0–2.5)
ALT: 47 U/L — ABNORMAL HIGH (ref 9–46)
AST: 23 U/L (ref 10–40)
Albumin: 4.6 g/dL (ref 3.6–5.1)
Alkaline phosphatase (APISO): 61 U/L (ref 36–130)
BUN: 9 mg/dL (ref 7–25)
CO2: 29 mmol/L (ref 20–32)
Calcium: 10 mg/dL (ref 8.6–10.3)
Chloride: 104 mmol/L (ref 98–110)
Creat: 1.03 mg/dL (ref 0.60–1.26)
Globulin: 2.8 g/dL (calc) (ref 1.9–3.7)
Glucose, Bld: 89 mg/dL (ref 65–99)
Potassium: 5.2 mmol/L (ref 3.5–5.3)
Sodium: 140 mmol/L (ref 135–146)
Total Bilirubin: 0.5 mg/dL (ref 0.2–1.2)
Total Protein: 7.4 g/dL (ref 6.1–8.1)
eGFR: 96 mL/min/{1.73_m2} (ref >=60)

## 2021-09-30 LAB — LIPID PANEL W/REFLEX DIRECT LDL
Cholesterol: 215 mg/dL — ABNORMAL HIGH (ref <200)
HDL: 35 mg/dL — ABNORMAL LOW (ref >=40)
LDL Cholesterol (Calc): 155 mg/dL (calc) — ABNORMAL HIGH
Non-HDL Cholesterol (Calc): 180 mg/dL (calc) — ABNORMAL HIGH (ref <130)
Total CHOL/HDL Ratio: 6.1 (calc) — ABNORMAL HIGH (ref <5.0)
Triglycerides: 123 mg/dL (ref <150)

## 2021-09-30 LAB — TSH: TSH: 3.46 mIU/L (ref 0.40–4.50)

## 2021-10-02 ENCOUNTER — Other Ambulatory Visit: Payer: Self-pay | Admitting: Family Medicine

## 2021-10-02 DIAGNOSIS — E038 Other specified hypothyroidism: Secondary | ICD-10-CM

## 2021-12-03 ENCOUNTER — Encounter: Payer: Self-pay | Admitting: Internal Medicine

## 2021-12-07 ENCOUNTER — Ambulatory Visit: Payer: BC Managed Care – PPO | Admitting: Internal Medicine

## 2021-12-25 ENCOUNTER — Ambulatory Visit: Payer: BC Managed Care – PPO | Admitting: Internal Medicine

## 2021-12-28 ENCOUNTER — Other Ambulatory Visit: Payer: Self-pay | Admitting: Family Medicine

## 2021-12-28 DIAGNOSIS — E038 Other specified hypothyroidism: Secondary | ICD-10-CM

## 2022-01-14 ENCOUNTER — Ambulatory Visit: Payer: BC Managed Care – PPO | Admitting: Internal Medicine

## 2022-01-21 NOTE — Progress Notes (Signed)
FOLLOW UP Date of Service/Encounter:  01/22/22   Subjective:  Austin Miranda (DOB: 1986/03/30) is a 36 y.o. male who [facility] in re-evaluation of the following: Asthma, allergic rhinitis, hives History obtained from: chart review and patient  For Review, LV was on 08/06/22  with Dr.Dorie Ohms seen for routine follow-up.  St this visit, he was using albuterol 2-3 times/week.  He continued to be active in sports doing triathalons, swimming causing him SOB but doing okay with other activities.  His hives were improving. His FEV1 was 59% at last visit, still showing moderate obstruction. This was > 10% improved from initial spirometry. We increased him to AirDuo 232.   Pertinent history/diagnostics: - Spirometry 07/03/2021: Ratio 73%, FEV1 42% predicted, +27% post bronchodilator response (460 L) and FEV1, and 31% improvement in FVC (870 L) -Has been on [facility] in the [facility]; stopped in 2014  Today presents for follow-up.  He has been traveling to [place] and [place] and has been doing well.  He did get a URI virus when in [place] so his one week trip turned into a 2 weeks trip due to his illness.  He was prescribed Levaquin and this cleared up his symptoms fairly quickly.  He has not had to use his rescue inhaler at all since his last visit.  He does feel like his breathing has improved since starting AirDuo, but recently received a notification from [facility] pharmacy that they are no longer doing the pharmacy benefit.  He is curious about whether or not he should continue this medication He is not exercising much.has not exercised in the past 6 months for multiple reasons, but reports feeling well.  He has gained a little bit of weight and notes that this does affect his breathing. Otherwise his hives and allergic rhinitis have been stable.   Allergies as of 01/22/2022   No Known Allergies      Medication List         Accurate as of January 22, 2022  1:10 PM. If you have any questions, ask your nurse or doctor.          STOP taking these medications    AirDuo Digihaler 232-14 MCG/ACT Aepb Generic drug: Fluticasone-Salmeterol(sensor) Stopped by: Tonny Bollman, MD   Fish Oil 1000 MG Caps Stopped by: Tonny Bollman, MD       TAKE these medications    albuterol 108 (90 Base) MCG/ACT inhaler Commonly known as: VENTOLIN HFA USE 2 INHALATIONS EVERY 6 HOURS AS NEEDED FOR WHEEZING   Breztri Aerosphere 160-9-4.8 MCG/ACT Aero Generic drug: Budeson-Glycopyrrol-Formoterol Inhale 2 puffs into the lungs in the morning and at bedtime. Started by: Tonny Bollman, MD   EPINEPHrine 0.3 mg/0.3 mL Soaj injection Commonly known as: EPI-PEN SMARTSIG:0.3 Milligram(s) IM Once   levocetirizine 5 MG tablet Commonly known as: XYZAL Take 1 tablet (5 mg total) by mouth every evening. What changed:  when to take this reasons to take this   levothyroxine 125 MCG tablet Commonly known as: SYNTHROID TAKE 1 TABLET DAILY.   multivitamin with minerals Tabs tablet Take 1 tablet by mouth daily.       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 No pertinent surgical history. Otherwise, there have been no changes to his past medical history, surgical history, family history, or social  history.  ROS: All others negative except as noted per HPI.   Objective:  BP 118/68   Pulse 90   Temp 98.3 F (36.8 C) (Temporal)   Resp 16   SpO2 98%  There is no height or weight on file to calculate BMI. Physical Exam: General Appearance:  Alert, cooperative, no distress, appears stated age  Head:  Normocephalic, without obvious abnormality, atraumatic  Eyes:  Conjunctiva clear, EOM's intact  Nose: Nares normal, normal mucosa and no visible anterior polyps  Throat: Lips, tongue normal; teeth  and gums normal, normal posterior oropharynx  Neck: Supple, symmetrical  Lungs:   clear to auscultation bilaterally, Respirations unlabored, no coughing  Heart:  regular rate and rhythm and no murmur, Appears well perfused  Extremities: No edema  Skin: Skin color, texture, turgor normal, no rashes or lesions on visualized portions of skin  Neurologic: No gross deficits  Spirometry:  Tracings reviewed. His effort: Good reproducible efforts. FVC: 3.21L FEV1: 2.17L, 54% predicted FEV1/FVC ratio: 83% Interpretation: Spirometry consistent with mixed obstructive and restrictive disease.  Please see scanned spirometry results for details.  Assessment/Plan   Mr. Latorre is a 36 year old male that we are following for asthma, allergic rhinitis and hives.  He reports feeling great and has not needed his rescue inhaler at all since last visit.  He has been taking his controller inhaler daily as prescribed.  However, his spirometry continues to show moderate to severe obstruction with restriction.  Symptomatically he never feels obstructed, but does note improvement in his breathing since starting a controller inhaler.  Continue to suspect that his lack of perceived symptom is due to lifelong history of uncontrolled asthma without treatment.  We have seen some improvement in his spirometry since starting treatment, and discussed staying where he is at now regarding therapy or continuing to escalate his therapy with the hopes that his FEV1 will continue to improve though I doubt he notices much symptomatic improvement.  He would like to try escalating to see if that helps.  We also discussed obtaining labs for severe asthmatics to rule out things like alpha 1 antitrypsin, but he prefers to hold on this at this time.  We also discussed Biologics which can also be used for severe asthmatics as an option.  We may further explore this pending his course over time.  Severe Persistent Asthma: controlled - your lung  testing today still shows moderate obstruction but does look better from your first appointment - Start Breztri 160 mcg, 2 puff twice a day with a spacer; THIS SHOULD BE USED EVERY DAY - Rinse mouth out after use - Use Albuterol (Proair/Ventolin) 2 puffs every 4-6 hours as needed for chest tightness, wheezing, or coughing - Use Albuterol (Proair/Ventolin) 2 puffs 15 minutes prior to exercise if you have symptoms with activity - Asthma is not controlled if:  - Symptoms are occurring >2 times a week OR  - >2 times a month nighttime awakenings  - Please call the clinic to schedule a follow up if these symptoms arise - check out the book Breath-you might enjoy reading about some of the breathing exercises - exercise!!! Aim for 2-3 times per week.   Allergic Rhinitis: controlled - allergen avoidance  - Continue Nasal Steroid Spray: Options include Flonase (fluticasone), Nasocort (triamcinolone), Nasonex (mometasome) 1- 2 sprays in each nostril daily (can buy over-the-counter if not covered by insurance)  Best results if used daily during bothersome season (Spring). - Continue over the counter antihistamine daily or  daily as needed.   -Your options include Zyrtec (Cetirizine) 10mg , Claritin (Loratadine) 10mg , Allegra (Fexofenadine) 180mg , or Xyzal (Levocetirinze) 5mg   Chronic Idiopathic Urticaria: controlled - Xyzal (levocetirizine) 5mg  once daily or once every other day - if hives are uncontrolled, increase Xyzal (levocetirizine) to 5mg   twice daily - if hives remain uncontrolled, increase dose of Xyzal (levocetirizine) 10mg  (2 pills) twice daily- this is maximum dose - can increase or decrease dosing depending on symptom control to a maximum dose of 4 tablets of antihistamine daily. Wait until hives free for at least one month prior to decreasing dose.   Use the least amount of medicine to control your hives. - if hives are still uncontrolled with the above regimen, please arrange an appointment  for discussion of Xolair (omalizumab)- an injectable medication for hives  Can use one of the following in place of zyrtec if desires: Claritin (loratadine) 10 mg, Xyzal (levocetirizine) 5 mg or Allegra (fexofenadine) 180 mg daily as needed   Follow-up in 3 to 4 months, sooner if needed.  It was a pleasure seeing you again in clinic today!  Tonny BollmanErin Seva Chancy, MD  [facility]

## 2022-01-22 ENCOUNTER — Encounter: Payer: Self-pay | Admitting: Internal Medicine

## 2022-01-22 ENCOUNTER — Ambulatory Visit (INDEPENDENT_AMBULATORY_CARE_PROVIDER_SITE_OTHER): Payer: BC Managed Care – PPO | Admitting: Internal Medicine

## 2022-01-22 VITALS — BP 118/68 | HR 90 | Temp 98.3°F | Resp 16

## 2022-01-22 DIAGNOSIS — L501 Idiopathic urticaria: Secondary | ICD-10-CM

## 2022-01-22 DIAGNOSIS — J4550 Severe persistent asthma, uncomplicated: Secondary | ICD-10-CM

## 2022-01-22 DIAGNOSIS — J302 Other seasonal allergic rhinitis: Secondary | ICD-10-CM | POA: Diagnosis not present

## 2022-01-22 MED ORDER — BREZTRI AEROSPHERE 160-9-4.8 MCG/ACT IN AERO: 2.0000 | INHALATION_SPRAY | Freq: Two times a day (BID) | RESPIRATORY_TRACT | 5 refills | Status: DC

## 2022-01-22 NOTE — Patient Instructions (Addendum)
Severe Persistent Asthma: - your lung testing today still shows moderate obstruction but does look better from your first appointment - Start Breztri 160 mcg, 2 puff twice a day with a spacer; THIS SHOULD BE USED EVERY DAY - Rinse mouth out after use - Use Albuterol (Proair/Ventolin) 2 puffs every 4-6 hours as needed for chest tightness, wheezing, or coughing - Use Albuterol (Proair/Ventolin) 2 puffs 15 minutes prior to exercise if you have symptoms with activity - Asthma is not controlled if:  - Symptoms are occurring >2 times a week OR  - >2 times a month nighttime awakenings  - Please call the clinic to schedule a follow up if these symptoms arise - check out the book Breath-you might enjoy reading about some of the breathing exercises - exercise!!! Aim for 2-3 times per week.   Allergic Rhinitis - allergen avoidance  - Continue Nasal Steroid Spray: Options include Flonase (fluticasone), Nasocort (triamcinolone), Nasonex (mometasome) 1- 2 sprays in each nostril daily (can buy over-the-counter if not covered by insurance)  Best results if used daily during bothersome season (Spring). - Continue over the counter antihistamine daily or daily as needed.   -Your options include Zyrtec (Cetirizine) 10mg , Claritin (Loratadine) 10mg , Allegra (Fexofenadine) 180mg , or Xyzal (Levocetirinze) 5mg   Chronic Idiopathic Urticaria - Xyzal (levocetirizine) 5mg  once daily or once every other day - if hives are uncontrolled, increase Xyzal (levocetirizine) to 5mg   twice daily - if hives remain uncontrolled, increase dose of Xyzal (levocetirizine) 10mg  (2 pills) twice daily- this is maximum dose - can increase or decrease dosing depending on symptom control to a maximum dose of 4 tablets of antihistamine daily. Wait until hives free for at least one month prior to decreasing dose.   Use the least amount of medicine to control your hives. - if hives are still uncontrolled with the above regimen, please arrange  an appointment for discussion of Xolair (omalizumab)- an injectable medication for hives  Can use one of the following in place of zyrtec if desires: Claritin (loratadine) 10 mg, Xyzal (levocetirizine) 5 mg or Allegra (fexofenadine) 180 mg daily as needed   Follow-up in 3 to 4 months, sooner if needed.  It was a pleasure seeing you again in clinic today!

## 2022-01-22 NOTE — Addendum Note (Signed)
Addended by: Felipa Emory on: 01/22/2022 04:13 PM   Modules accepted: Orders

## 2022-03-01 ENCOUNTER — Encounter: Payer: Self-pay | Admitting: Internal Medicine

## 2022-03-01 ENCOUNTER — Other Ambulatory Visit: Payer: Self-pay

## 2022-03-01 MED ORDER — BREZTRI AEROSPHERE 160-9-4.8 MCG/ACT IN AERO: 2.0000 | INHALATION_SPRAY | Freq: Two times a day (BID) | RESPIRATORY_TRACT | 0 refills | Status: DC

## 2022-05-12 ENCOUNTER — Other Ambulatory Visit: Payer: Self-pay | Admitting: Internal Medicine

## 2022-05-20 ENCOUNTER — Encounter: Payer: Self-pay | Admitting: Internal Medicine

## 2022-05-24 ENCOUNTER — Ambulatory Visit: Payer: BC Managed Care – PPO | Admitting: Internal Medicine

## 2022-10-29 ENCOUNTER — Other Ambulatory Visit: Payer: Self-pay | Admitting: Family Medicine

## 2022-10-29 DIAGNOSIS — E038 Other specified hypothyroidism: Secondary | ICD-10-CM

## 2022-11-01 ENCOUNTER — Telehealth: Payer: Self-pay | Admitting: Family Medicine

## 2022-11-01 DIAGNOSIS — E038 Other specified hypothyroidism: Secondary | ICD-10-CM

## 2022-11-01 NOTE — Telephone Encounter (Signed)
Pt called. He is requesting a refill on his Levothyroxine and Abuterol.

## 2022-11-08 MED ORDER — ALBUTEROL SULFATE HFA 108 (90 BASE) MCG/ACT IN AERS: INHALATION_SPRAY | RESPIRATORY_TRACT | 0 refills | Status: DC

## 2022-11-08 MED ORDER — LEVOTHYROXINE SODIUM 125 MCG PO TABS: ORAL_TABLET | ORAL | 0 refills | Status: DC

## 2022-11-08 NOTE — Telephone Encounter (Signed)
Pls contact the patient to schedule OV with Dr. Zigmund Daniel. Last seen 09/2021. Sending 30 day medication refill. No additional refills without OV. Thanks

## 2022-11-08 NOTE — Telephone Encounter (Signed)
Lvm for patient to call back to schedule an appointment for med refill for Levothyroxine and Abuterol with Dr. Zigmund Daniel. tvt

## 2022-12-15 ENCOUNTER — Ambulatory Visit (INDEPENDENT_AMBULATORY_CARE_PROVIDER_SITE_OTHER): Payer: BC Managed Care – PPO | Admitting: Family Medicine

## 2022-12-15 ENCOUNTER — Encounter: Payer: Self-pay | Admitting: Family Medicine

## 2022-12-15 VITALS — BP 123/74 | HR 76 | Ht 69.0 in | Wt 170.0 lb

## 2022-12-15 DIAGNOSIS — E785 Hyperlipidemia, unspecified: Secondary | ICD-10-CM | POA: Diagnosis not present

## 2022-12-15 DIAGNOSIS — E038 Other specified hypothyroidism: Secondary | ICD-10-CM

## 2022-12-15 DIAGNOSIS — L509 Urticaria, unspecified: Secondary | ICD-10-CM | POA: Diagnosis not present

## 2022-12-15 DIAGNOSIS — Z0000 Encounter for general adult medical examination without abnormal findings: Secondary | ICD-10-CM

## 2022-12-15 MED ORDER — LEVOTHYROXINE SODIUM 125 MCG PO TABS: ORAL_TABLET | ORAL | 3 refills | Status: DC

## 2022-12-15 NOTE — Progress Notes (Signed)
Austin Miranda - 37 y.o. male MRN [id number]  Date of birth: May 10, 1986  Subjective Chief Complaint  Patient presents with   Annual Exam    HPI Austin Miranda is a 37 y.o. male here today for annual exam.   He is in pretty good health.  History of hypothyroidism and asthma.  He reports he is doing well with current medications.   He is moderately active and feels that diet is pretty good.   Non-smoker.  Denies EtOH use.   Review of Systems  Constitutional:  Negative for chills, fever, malaise/fatigue and weight loss.  HENT:  Negative for congestion, ear pain and sore throat.   Eyes:  Negative for blurred vision, double vision and pain.  Respiratory:  Negative for cough and shortness of breath.   Cardiovascular:  Negative for chest pain and palpitations.  Gastrointestinal:  Negative for abdominal pain, blood in stool, constipation, heartburn and nausea.  Genitourinary:  Negative for dysuria and urgency.  Musculoskeletal:  Negative for joint pain and myalgias.  Neurological:  Negative for dizziness and headaches.  Endo/Heme/Allergies:  Does not bruise/bleed easily.  Psychiatric/Behavioral:  Negative for depression. The patient is not nervous/anxious and does not have insomnia.     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 No pertinent surgical history.  Social History   Socioeconomic History   Marital status: Married    Spouse name: Not on file   Number of children: Not on file   Years of education: Not on file   Highest education level: Not on file  Occupational History   Not on file  Tobacco Use   Smoking status: Former    Years: 4    Types: Cigarettes    Quit date: 2010    Years since quitting: 14.3   Smokeless tobacco: Never   Tobacco comments:    8/10 ciig a day  Vaping Use   Vaping Use: Never used   Substance and Sexual Activity   Alcohol use: No    Alcohol/week: 0.0 standard drinks of alcohol   Drug use: No   Sexual activity: Yes  Other Topics Concern   Not on file  Social History Narrative   Not on file   Social Determinants of Health   Financial Resource Strain: Not on file  Food Insecurity: Not on file  Transportation Needs: Not on file  Physical Activity: Sufficiently Active (05/23/2018)   Exercise Vital Sign    Days of Exercise per Week: 5 days    Minutes of Exercise per Session: 50 min  Stress: Not on file  Social Connections: Not on file    Family History  Problem Relation Age of Onset   Hypertension Mother    Diabetes Mother    Asthma Neg Hx    Allergic rhinitis Neg Hx    Eczema Neg Hx    Immunodeficiency Neg Hx    Urticaria Neg Hx    Angioedema Neg Hx     Health Maintenance  Topic Date Due   COVID-19 Vaccine (5 - 2023-24 season) 07/03/2023 (Originally 04/16/2022)   Hepatitis C Screening  12/15/2023 (Originally 08/14/2002)   INFLUENZA VACCINE  03/17/2023   DTaP/Tdap/Td (2 - Td or Tdap) 03/17/2026   HIV Screening  Completed   HPV VACCINES  Aged Out     ----------------------------------------------------------------------------------------------------------------------------------------------------------------------------------------------------------------- Physical Exam BP 123/74 (BP Location: Left Arm,  Patient Position: Sitting, Cuff Size: Normal)   Pulse 76   Ht 5\' 9"  (1.753 m)   Wt 170 lb (77.1 kg)   SpO2 98%   BMI 25.10 kg/m   Physical Exam Constitutional:      General: He is not in acute distress. HENT:     Head: Normocephalic and atraumatic.     Right Ear: Tympanic membrane and external ear normal.     Left Ear: Tympanic membrane and external ear normal.  Eyes:     General: No scleral icterus. Neck:     Thyroid: No thyromegaly.  Cardiovascular:     Rate and Rhythm: Normal rate and regular rhythm.     Heart sounds: Normal heart  sounds.  Pulmonary:     Effort: Pulmonary effort is normal.     Breath sounds: Normal breath sounds.  Abdominal:     General: Bowel sounds are normal. There is no distension.     Palpations: Abdomen is soft.     Tenderness: There is no abdominal tenderness. There is no guarding.  Musculoskeletal:     Cervical back: Normal range of motion.  Lymphadenopathy:     Cervical: No cervical adenopathy.  Skin:    General: Skin is warm and dry.     Findings: No rash.  Neurological:     Mental Status: He is alert and oriented to person, place, and time.     Cranial Nerves: No cranial nerve deficit.     Motor: No abnormal muscle tone.  Psychiatric:        Mood and Affect: Mood normal.        Behavior: Behavior normal.     ------------------------------------------------------------------------------------------------------------------------------------------------------------------------------------------------------------------- Assessment and Plan  Well adult exam Well adult Orders Placed This Encounter  Procedures   COMPLETE METABOLIC PANEL WITH GFR   CBC with Differential   Lipid Panel w/reflex Direct LDL   TSH  Screenings: per lab orders Immunizations: UTD Anticipatory guidance/Risk factor reduction:  Recommendations per AVS.    Meds ordered this encounter  Medications   levothyroxine (SYNTHROID) 125 MCG tablet    Sig: TAKE 1 TABLET DAILY.    Dispense:  90 tablet    Refill:  3    No follow-ups on file.    This visit occurred during the SARS-CoV-2 public health emergency.  Safety protocols were in place, including screening questions prior to the visit, additional usage of staff PPE, and extensive cleaning of exam room while observing appropriate contact time as indicated for disinfecting solutions.

## 2022-12-15 NOTE — Assessment & Plan Note (Signed)
Well adult Orders Placed This Encounter  Procedures   COMPLETE METABOLIC PANEL WITH GFR   CBC with Differential   Lipid Panel w/reflex Direct LDL   TSH  Screenings: per lab orders Immunizations: UTD Anticipatory guidance/Risk factor reduction:  Recommendations per AVS.  

## 2022-12-15 NOTE — Patient Instructions (Signed)
Preventive Care 21-39 Years Old, Male Preventive care refers to lifestyle choices and visits with your health care provider that can promote health and wellness. Preventive care visits are also called wellness exams. What can I expect for my preventive care visit? Counseling During your preventive care visit, your health care provider may ask about your: Medical history, including: Past medical problems. Family medical history. Current health, including: Emotional well-being. Home life and relationship well-being. Sexual activity. Lifestyle, including: Alcohol, nicotine or tobacco, and drug use. Access to firearms. Diet, exercise, and sleep habits. Safety issues such as seatbelt and bike helmet use. Sunscreen use. Work and work environment. Physical exam Your health care provider may check your: Height and weight. These may be used to calculate your BMI (body mass index). BMI is a measurement that tells if you are at a healthy weight. Waist circumference. This measures the distance around your waistline. This measurement also tells if you are at a healthy weight and may help predict your risk of certain diseases, such as type 2 diabetes and high blood pressure. Heart rate and blood pressure. Body temperature. Skin for abnormal spots. What immunizations do I need?  Vaccines are usually given at various ages, according to a schedule. Your health care provider will recommend vaccines for you based on your age, medical history, and lifestyle or other factors, such as travel or where you work. What tests do I need? Screening Your health care provider may recommend screening tests for certain conditions. This may include: Lipid and cholesterol levels. Diabetes screening. This is done by checking your blood sugar (glucose) after you have not eaten for a while (fasting). Hepatitis B test. Hepatitis C test. HIV (human immunodeficiency virus) test. STI (sexually transmitted infection)  testing, if you are at risk. Talk with your health care provider about your test results, treatment options, and if necessary, the need for more tests. Follow these instructions at home: Eating and drinking  Eat a healthy diet that includes fresh fruits and vegetables, whole grains, lean protein, and low-fat dairy products. Drink enough fluid to keep your urine pale yellow. Take vitamin and mineral supplements as recommended by your health care provider. Do not drink alcohol if your health care provider tells you not to drink. If you drink alcohol: Limit how much you have to 0-2 drinks a day. Know how much alcohol is in your drink. In the [place] one drink equals one 12 oz bottle of beer (355 mL), one 5 oz glass of wine (148 mL), or one 1 oz glass of hard liquor (44 mL). Lifestyle Brush your teeth every morning and night with fluoride toothpaste. Floss one time each day. Exercise for at least 30 minutes 5 or more days each week. Do not use any products that contain nicotine or tobacco. These products include cigarettes, chewing tobacco, and vaping devices, such as e-cigarettes. If you need help quitting, ask your health care provider. Do not use drugs. If you are sexually active, practice safe sex. Use a condom or other form of protection to prevent STIs. Find healthy ways to manage stress, such as: Meditation, yoga, or listening to music. Journaling. Talking to a trusted person. Spending time with friends and family. Minimize exposure to UV radiation to reduce your risk of skin cancer. Safety Always wear your seat belt while driving or riding in a vehicle. Do not drive: If you have been drinking alcohol. Do not ride with someone who has been drinking. If you have been using any mind-altering substances   or drugs. While texting. When you are tired or distracted. Wear a helmet and other protective equipment during sports activities. If you have firearms in your house, make sure you  follow all gun safety procedures. Seek help if you have been physically or sexually abused. What's next? Go to your health care provider once a year for an annual wellness visit. Ask your health care provider how often you should have your eyes and teeth checked. Stay up to date on all vaccines. This information is not intended to replace advice given to you by your health care provider. Make sure you discuss any questions you have with your health care provider. Document Revised: 01/28/2021 Document Reviewed: 01/28/2021 Elsevier Patient Education  2023 [employer]  

## 2022-12-17 DIAGNOSIS — Z0000 Encounter for general adult medical examination without abnormal findings: Secondary | ICD-10-CM | POA: Diagnosis not present

## 2022-12-17 DIAGNOSIS — E785 Hyperlipidemia, unspecified: Secondary | ICD-10-CM | POA: Diagnosis not present

## 2022-12-17 DIAGNOSIS — E038 Other specified hypothyroidism: Secondary | ICD-10-CM | POA: Diagnosis not present

## 2022-12-18 LAB — COMPLETE METABOLIC PANEL WITH GFR
AG Ratio: 1.7 (calc) (ref 1.0–2.5)
ALT: 57 U/L — ABNORMAL HIGH (ref 9–46)
AST: 27 U/L (ref 10–40)
Albumin: 4.6 g/dL (ref 3.6–5.1)
Alkaline phosphatase (APISO): 74 U/L (ref 36–130)
BUN: 11 mg/dL (ref 7–25)
CO2: 28 mmol/L (ref 20–32)
Calcium: 10.2 mg/dL (ref 8.6–10.3)
Chloride: 103 mmol/L (ref 98–110)
Creat: 1.07 mg/dL (ref 0.60–1.26)
Globulin: 2.7 g/dL (calc) (ref 1.9–3.7)
Glucose, Bld: 83 mg/dL (ref 65–99)
Potassium: 4.8 mmol/L (ref 3.5–5.3)
Sodium: 140 mmol/L (ref 135–146)
Total Bilirubin: 0.6 mg/dL (ref 0.2–1.2)
Total Protein: 7.3 g/dL (ref 6.1–8.1)
eGFR: 91 mL/min/{1.73_m2} (ref >=60)

## 2022-12-18 LAB — CBC WITH DIFFERENTIAL/PLATELET
Absolute Monocytes: 538 cells/uL (ref 200–950)
Basophils Absolute: 77 cells/uL (ref 0–200)
Basophils Relative: 1.2 %
Eosinophils Absolute: 1165 cells/uL — ABNORMAL HIGH (ref 15–500)
Eosinophils Relative: 18.2 %
HCT: 51.7 % — ABNORMAL HIGH (ref 38.5–50.0)
Hemoglobin: 17 g/dL (ref 13.2–17.1)
Lymphs Abs: 1856 cells/uL (ref 850–3900)
MCH: 28.9 pg (ref 27.0–33.0)
MCHC: 32.9 g/dL (ref 32.0–36.0)
MCV: 87.9 fL (ref 80.0–100.0)
MPV: 12.4 fL (ref 7.5–12.5)
Monocytes Relative: 8.4 %
Neutro Abs: 2765 cells/uL (ref 1500–7800)
Neutrophils Relative %: 43.2 %
Platelets: 268 10*3/uL (ref 140–400)
RBC: 5.88 10*6/uL — ABNORMAL HIGH (ref 4.20–5.80)
RDW: 15 % (ref 11.0–15.0)
Total Lymphocyte: 29 %
WBC: 6.4 10*3/uL (ref 3.8–10.8)

## 2022-12-18 LAB — LIPID PANEL W/REFLEX DIRECT LDL
Cholesterol: 252 mg/dL — ABNORMAL HIGH (ref <200)
HDL: 38 mg/dL — ABNORMAL LOW (ref >=40)
LDL Cholesterol (Calc): 178 mg/dL (calc) — ABNORMAL HIGH
Non-HDL Cholesterol (Calc): 214 mg/dL (calc) — ABNORMAL HIGH (ref <130)
Total CHOL/HDL Ratio: 6.6 (calc) — ABNORMAL HIGH (ref <5.0)
Triglycerides: 203 mg/dL — ABNORMAL HIGH (ref <150)

## 2022-12-18 LAB — TSH: TSH: 4.97 mIU/L — ABNORMAL HIGH (ref 0.40–4.50)

## 2022-12-24 ENCOUNTER — Other Ambulatory Visit: Payer: Self-pay | Admitting: Family Medicine

## 2022-12-24 DIAGNOSIS — E038 Other specified hypothyroidism: Secondary | ICD-10-CM

## 2022-12-24 MED ORDER — LEVOTHYROXINE SODIUM 137 MCG PO TABS
ORAL_TABLET | ORAL | 0 refills | Status: DC
Start: 2022-12-24 — End: 2023-04-06

## 2023-03-11 DIAGNOSIS — J101 Influenza due to other identified influenza virus with other respiratory manifestations: Secondary | ICD-10-CM | POA: Diagnosis not present

## 2023-03-11 DIAGNOSIS — Z20822 Contact with and (suspected) exposure to covid-19: Secondary | ICD-10-CM | POA: Diagnosis not present

## 2023-04-01 ENCOUNTER — Telehealth: Payer: Self-pay

## 2023-04-01 NOTE — Telephone Encounter (Signed)
Received a message from [facility] requesting refill of levothyroxine Last written 12/24/2022  last OV [id number] Last labs 12/17/2022 with a note to return in 6 to 8 weeks for repeat TSH on increased strength Called patient and left a detailed voice mail message regarding the need for lab work and that the order was in his chart and he could just top by the office for the lab to be drawn . I stated we were open at 8am on Monday 04/04/2023 for lab draw.

## 2023-04-05 ENCOUNTER — Other Ambulatory Visit: Payer: Self-pay

## 2023-04-05 DIAGNOSIS — E038 Other specified hypothyroidism: Secondary | ICD-10-CM

## 2023-04-05 NOTE — Progress Notes (Signed)
 Switched to [employer].

## 2023-04-06 ENCOUNTER — Other Ambulatory Visit: Payer: Self-pay | Admitting: Family Medicine

## 2023-04-06 DIAGNOSIS — E038 Other specified hypothyroidism: Secondary | ICD-10-CM

## 2023-04-06 LAB — TSH: TSH: 3.75 u[IU]/mL (ref 0.450–4.500)

## 2023-04-06 MED ORDER — LEVOTHYROXINE SODIUM 137 MCG PO TABS: ORAL_TABLET | ORAL | 1 refills | Status: DC

## 2023-04-11 ENCOUNTER — Other Ambulatory Visit: Payer: Self-pay | Admitting: Internal Medicine

## 2023-07-28 ENCOUNTER — Encounter: Payer: Self-pay | Admitting: Family Medicine

## 2023-07-28 DIAGNOSIS — E038 Other specified hypothyroidism: Secondary | ICD-10-CM

## 2023-07-28 MED ORDER — LEVOTHYROXINE SODIUM 137 MCG PO TABS
ORAL_TABLET | ORAL | 0 refills | Status: DC
Start: 2023-07-28 — End: 2023-11-09

## 2023-10-03 DIAGNOSIS — J22 Unspecified acute lower respiratory infection: Secondary | ICD-10-CM | POA: Diagnosis not present

## 2023-10-03 DIAGNOSIS — Z20822 Contact with and (suspected) exposure to covid-19: Secondary | ICD-10-CM | POA: Diagnosis not present

## 2023-10-03 DIAGNOSIS — R051 Acute cough: Secondary | ICD-10-CM | POA: Diagnosis not present

## 2023-10-03 DIAGNOSIS — R509 Fever, unspecified: Secondary | ICD-10-CM | POA: Diagnosis not present

## 2023-10-06 ENCOUNTER — Other Ambulatory Visit: Payer: Self-pay | Admitting: Internal Medicine

## 2023-11-02 ENCOUNTER — Ambulatory Visit: Admitting: Family Medicine

## 2023-11-07 ENCOUNTER — Other Ambulatory Visit: Payer: Self-pay | Admitting: Family Medicine

## 2023-11-07 DIAGNOSIS — E038 Other specified hypothyroidism: Secondary | ICD-10-CM

## 2023-11-11 ENCOUNTER — Ambulatory Visit: Admitting: Family Medicine

## 2023-11-14 ENCOUNTER — Encounter: Payer: Self-pay | Admitting: Family Medicine

## 2023-11-14 ENCOUNTER — Ambulatory Visit (INDEPENDENT_AMBULATORY_CARE_PROVIDER_SITE_OTHER): Admitting: Family Medicine

## 2023-11-14 ENCOUNTER — Ambulatory Visit

## 2023-11-14 VITALS — BP 110/71 | HR 72 | Ht 69.0 in | Wt 170.0 lb

## 2023-11-14 DIAGNOSIS — M545 Low back pain, unspecified: Secondary | ICD-10-CM | POA: Diagnosis not present

## 2023-11-14 MED ORDER — PREDNISONE 10 MG (21) PO TBPK: ORAL_TABLET | ORAL | 0 refills | Status: DC

## 2023-11-14 MED ORDER — CYCLOBENZAPRINE HCL 10 MG PO TABS: 10.0000 mg | ORAL_TABLET | Freq: Three times a day (TID) | ORAL | 0 refills | Status: AC | PRN

## 2023-11-14 NOTE — Assessment & Plan Note (Signed)
 Acute on chronic low back pain, now with radiation into the buttock.  Xrays ordered.  Adding course of prednisone and flexeril as needed.  Red flags reviewed.  Continue home exercises.  Can add on formal PT as well if not improving.

## 2023-11-14 NOTE — Progress Notes (Signed)
 Austin Miranda - 38 y.o. male MRN [id number]  Date of birth: 09-01-1985  Subjective Chief Complaint  Patient presents with   Back Pain    HPI Austin Miranda is a 38 y.o. male here today with complaint of lower back pain on the R side with radiation into the R hip.  Symptoms started about 3 weeks ago on a flight however he has had some issues off and on for a few years.  He has tried stretching and this usually helps but has not been too helpful this time.  No radiation into the legs.  No weakness, numbness, tingling.   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 No pertinent surgical history.  Social History   Socioeconomic History   Marital status: Married    Spouse name: Not on file   Number of children: Not on file   Years of education: Not on file   Highest education level: Not on file  Occupational History   Not on file  Tobacco Use   Smoking status: Former    Current packs/day: 0.00    Types: Cigarettes    Start date: 2006    Quit date: 2010    Years since quitting: 15.2   Smokeless tobacco: Never   Tobacco comments:    8/10 ciig a day  Vaping Use   Vaping status: Never Used  Substance and Sexual Activity   Alcohol use: No    Alcohol/week: 0.0 standard drinks of alcohol   Drug use: No   Sexual activity: Yes  Other Topics Concern   Not on file  Social History Narrative   Not on file   Social Drivers of Health   Financial Resource Strain: Not on file  Food Insecurity: Not on file  Transportation Needs: Not on file  Physical Activity: Sufficiently Active (05/23/2018)   Exercise Vital Sign    Days of Exercise per Week: 5 days    Minutes of Exercise per Session: 50 min  Stress: Not on file  Social Connections: Not on file     Family History  Problem Relation Age of Onset   Hypertension Mother    Diabetes Mother    Asthma Neg Hx    Allergic rhinitis Neg Hx    Eczema Neg Hx    Immunodeficiency Neg Hx    Urticaria Neg Hx    Angioedema Neg Hx     Health Maintenance  Topic Date Due   Pneumococcal Vaccine 47-33 Years old (1 of 2 - PCV) Never done   INFLUENZA VACCINE  03/17/2023   COVID-19 Vaccine (5 - 2024-25 season) 04/17/2023   Hepatitis C Screening  12/15/2023 (Originally 08/14/2002)   DTaP/Tdap/Td (3 - Td or Tdap) 07/10/2028   HIV Screening  Completed   HPV VACCINES  Aged Out     ----------------------------------------------------------------------------------------------------------------------------------------------------------------------------------------------------------------- Physical Exam BP 110/71 (BP Location: Left Arm, Patient Position: Sitting, Cuff Size: Normal)   Pulse 72   Ht 5\' 9"  (1.753 m)   Wt 170 lb (77.1 kg)   SpO2 98%   BMI 25.10 kg/m   Physical Exam Constitutional:      Appearance: Normal appearance.  Musculoskeletal:     Comments: Tightness of R sided lumbar paraspinals.  ROM limited with flexion.  Normal extension.  SLR  positive on R.   Neurological:     Mental Status: He is alert.     ------------------------------------------------------------------------------------------------------------------------------------------------------------------------------------------------------------------- Assessment and Plan  Right-sided low back pain without sciatica Acute on chronic low back pain, now with radiation into the buttock.  Xrays ordered.  Adding course of prednisone and flexeril as needed.  Red flags reviewed.  Continue home exercises.  Can add on formal PT as well if not improving.    Meds ordered this encounter  Medications   predniSONE (STERAPRED UNI-PAK 21 TAB) 10 MG (21) TBPK tablet    Sig: Taper as directed on packaging    Dispense:  21 tablet     Refill:  0   cyclobenzaprine (FLEXERIL) 10 MG tablet    Sig: Take 1 tablet (10 mg total) by mouth 3 (three) times daily as needed for muscle spasms.    Dispense:  30 tablet    Refill:  0    No follow-ups on file.    This visit occurred during the SARS-CoV-2 public health emergency.  Safety protocols were in place, including screening questions prior to the visit, additional usage of staff PPE, and extensive cleaning of exam room while observing appropriate contact time as indicated for disinfecting solutions.

## 2023-11-16 ENCOUNTER — Encounter: Payer: Self-pay | Admitting: Family Medicine

## 2023-12-15 ENCOUNTER — Telehealth: Payer: Self-pay | Admitting: Family Medicine

## 2023-12-15 NOTE — Telephone Encounter (Signed)
 Changed to a [employer]

## 2023-12-16 ENCOUNTER — Ambulatory Visit (INDEPENDENT_AMBULATORY_CARE_PROVIDER_SITE_OTHER): Admitting: Family Medicine

## 2023-12-16 ENCOUNTER — Encounter: Payer: Self-pay | Admitting: Family Medicine

## 2023-12-16 VITALS — BP 125/84 | HR 76 | Temp 98.1°F | Ht 69.0 in | Wt 169.0 lb

## 2023-12-16 DIAGNOSIS — Z0000 Encounter for general adult medical examination without abnormal findings: Secondary | ICD-10-CM

## 2023-12-16 DIAGNOSIS — E785 Hyperlipidemia, unspecified: Secondary | ICD-10-CM

## 2023-12-16 DIAGNOSIS — E559 Vitamin D deficiency, unspecified: Secondary | ICD-10-CM

## 2023-12-16 DIAGNOSIS — E038 Other specified hypothyroidism: Secondary | ICD-10-CM

## 2023-12-16 MED ORDER — ALBUTEROL SULFATE HFA 108 (90 BASE) MCG/ACT IN AERS: INHALATION_SPRAY | RESPIRATORY_TRACT | 3 refills | Status: AC

## 2023-12-16 MED ORDER — BREZTRI AEROSPHERE 160-9-4.8 MCG/ACT IN AERO: 2.0000 | INHALATION_SPRAY | Freq: Every day | RESPIRATORY_TRACT | 3 refills | Status: AC

## 2023-12-16 NOTE — Patient Instructions (Signed)
 Preventive Care 76-40 Years Old, Male Preventive care refers to lifestyle choices and visits with your health care provider that can promote health and wellness. Preventive care visits are also called wellness exams. What can I expect for my preventive care visit? Counseling During your preventive care visit, your health care provider may ask about your: Medical history, including: Past medical problems. Family medical history. Current health, including: Emotional well-being. Home life and relationship well-being. Sexual activity. Lifestyle, including: Alcohol, nicotine or tobacco, and drug use. Access to firearms. Diet, exercise, and sleep habits. Safety issues such as seatbelt and bike helmet use. Sunscreen use. Work and work Astronomer. Physical exam Your health care provider may check your: Height and weight. These may be used to calculate your BMI (body mass index). BMI is a measurement that tells if you are at a healthy weight. Waist circumference. This measures the distance around your waistline. This measurement also tells if you are at a healthy weight and may help predict your risk of certain diseases, such as type 2 diabetes and high blood pressure. Heart rate and blood pressure. Body temperature. Skin for abnormal spots. What immunizations do I need?  Vaccines are usually given at various ages, according to a schedule. Your health care provider will recommend vaccines for you based on your age, medical history, and lifestyle or other factors, such as travel or where you work. What tests do I need? Screening Your health care provider may recommend screening tests for certain conditions. This may include: Lipid and cholesterol levels. Diabetes screening. This is done by checking your blood sugar (glucose) after you have not eaten for a while (fasting). Hepatitis B test. Hepatitis C test. HIV (human immunodeficiency virus) test. STI (sexually transmitted infection)  testing, if you are at risk. Talk with your health care provider about your test results, treatment options, and if necessary, the need for more tests. Follow these instructions at home: Eating and drinking  Eat a healthy diet that includes fresh fruits and vegetables, whole grains, lean protein, and low-fat dairy products. Drink enough fluid to keep your urine pale yellow. Take vitamin and mineral supplements as recommended by your health care provider. Do not drink alcohol if your health care provider tells you not to drink. If you drink alcohol: Limit how much you have to 0-2 drinks a day. Know how much alcohol is in your drink. In the [place] one drink equals one 12 oz bottle of beer (355 mL), one 5 oz glass of wine (148 mL), or one 1 oz glass of hard liquor (44 mL). Lifestyle Brush your teeth every morning and night with fluoride toothpaste. Floss one time each day. Exercise for at least 30 minutes 5 or more days each week. Do not use any products that contain nicotine or tobacco. These products include cigarettes, chewing tobacco, and vaping devices, such as e-cigarettes. If you need help quitting, ask your health care provider. Do not use drugs. If you are sexually active, practice safe sex. Use a condom or other form of protection to prevent STIs. Find healthy ways to manage stress, such as: Meditation, yoga, or listening to music. Journaling. Talking to a trusted person. Spending time with friends and family. Minimize exposure to UV radiation to reduce your risk of skin cancer. Safety Always wear your seat belt while driving or riding in a vehicle. Do not drive: If you have been drinking alcohol. Do not ride with someone who has been drinking. If you have been using any mind-altering substances  or drugs. While texting. When you are tired or distracted. Wear a helmet and other protective equipment during sports activities. If you have firearms in your house, make sure you  follow all gun safety procedures. Seek help if you have been physically or sexually abused. What's next? Go to your health care provider once a year for an annual wellness visit. Ask your health care provider how often you should have your eyes and teeth checked. Stay up to date on all vaccines. This information is not intended to replace advice given to you by your health care provider. Make sure you discuss any questions you have with your health care provider. Document Revised: 01/28/2021 Document Reviewed: 01/28/2021 Elsevier Patient Education  2024 [employer].

## 2023-12-16 NOTE — Assessment & Plan Note (Signed)
 Well adult Orders Placed This Encounter  Procedures   CMP14+EGFR   CBC with Differential/Platelet   Lipid Panel With LDL/HDL Ratio   TSH + free T4   Vitamin D  (25 hydroxy)  Screenings: per lab orders Immunizations: UTD Anticipatory guidance/Risk factor reduction:  Recommendations per AVS.

## 2023-12-16 NOTE — Progress Notes (Signed)
 Austin Miranda - 38 y.o. male MRN [id number]  Date of birth: 1986-07-25  Subjective Chief Complaint  Patient presents with   Annual Exam    Fasting labs    HPI Austin Miranda is a 38 y.o. male here today for annual exam.   Reports that he is doing well.  He has history of asthma that is well controlled with breztri  daily and albuterol  prn.  [facility].   He is taking levothyroxine  as directed.   Moderately active.  He feels that diet is pretty good.   He is a non-smoker. Denies EtOH.   He does have regular dental care.   Review of Systems  Constitutional:  Negative for chills, fever, malaise/fatigue and weight loss.  HENT:  Negative for congestion, ear pain and sore throat.   Eyes:  Negative for blurred vision, double vision and pain.  Respiratory:  Negative for cough and shortness of breath.   Cardiovascular:  Negative for chest pain and palpitations.  Gastrointestinal:  Negative for abdominal pain, blood in stool, constipation, heartburn and nausea.  Genitourinary:  Negative for dysuria and urgency.  Musculoskeletal:  Negative for joint pain and myalgias.  Neurological:  Negative for dizziness and headaches.  Endo/Heme/Allergies:  Does not bruise/bleed easily.  Psychiatric/Behavioral:  Negative for depression. The patient is not nervous/anxious and does not have insomnia.     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 No pertinent surgical history.  Social History   Socioeconomic History   Marital status: Married    Spouse name: Not on file   Number of children: Not on file   Years of education: Not on file   Highest education level: Not on file  Occupational History   Not on file  Tobacco Use   Smoking status: Former    Current packs/day: 0.00    Types: Cigarettes    Start  date: 2006    Quit date: 2010    Years since quitting: 15.3   Smokeless tobacco: Never   Tobacco comments:    8/10 ciig a day  Vaping Use   Vaping status: Never Used  Substance and Sexual Activity   Alcohol use: No    Alcohol/week: 0.0 standard drinks of alcohol   Drug use: No   Sexual activity: Yes  Other Topics Concern   Not on file  Social History Narrative   Not on file   Social Drivers of Health   Financial Resource Strain: Not on file  Food Insecurity: Not on file  Transportation Needs: Not on file  Physical Activity: Sufficiently Active (05/23/2018)   Exercise Vital Sign    Days of Exercise per Week: 5 days    Minutes of Exercise per Session: 50 min  Stress: Not on file  Social Connections: Not on file    Family History  Problem Relation Age of Onset   Hypertension Mother    Diabetes Mother    Asthma Neg Hx    Allergic rhinitis Neg Hx    Eczema Neg Hx    Immunodeficiency Neg Hx    Urticaria Neg Hx    Angioedema Neg Hx     Health Maintenance  Topic Date Due   Hepatitis C Screening  Never done   Pneumococcal Vaccine 2-69 Years old (1 of 2 - PCV) Never done   COVID-19 Vaccine (  5 - 2024-25 season) 01/01/2024 (Originally 04/17/2023)   INFLUENZA VACCINE  03/16/2024   DTaP/Tdap/Td (3 - Td or Tdap) 07/10/2028   HIV Screening  Completed   HPV VACCINES  Aged Out   Meningococcal B Vaccine  Aged Out     ----------------------------------------------------------------------------------------------------------------------------------------------------------------------------------------------------------------- Physical Exam BP 125/84   Pulse 76   Temp 98.1 F (36.7 C) (Oral)   Ht 5\' 9"  (1.753 m)   Wt 169 lb (76.7 kg)   SpO2 99%   BMI 24.96 kg/m   Physical Exam Constitutional:      General: He is not in acute distress. HENT:     Head: Normocephalic and atraumatic.     Right Ear: Tympanic membrane and external ear normal.     Left Ear: Tympanic membrane  and external ear normal.  Eyes:     General: No scleral icterus. Neck:     Thyroid : No thyromegaly.  Cardiovascular:     Rate and Rhythm: Normal rate and regular rhythm.     Heart sounds: Normal heart sounds.  Pulmonary:     Effort: Pulmonary effort is normal.     Breath sounds: Normal breath sounds.  Abdominal:     General: Bowel sounds are normal. There is no distension.     Palpations: Abdomen is soft.     Tenderness: There is no abdominal tenderness. There is no guarding.  Musculoskeletal:     Cervical back: Normal range of motion.  Lymphadenopathy:     Cervical: No cervical adenopathy.  Skin:    General: Skin is warm and dry.     Findings: No rash.  Neurological:     Mental Status: He is alert and oriented to person, place, and time.     Cranial Nerves: No cranial nerve deficit.     Motor: No abnormal muscle tone.  Psychiatric:        Mood and Affect: Mood normal.        Behavior: Behavior normal.     ------------------------------------------------------------------------------------------------------------------------------------------------------------------------------------------------------------------- Assessment and Plan  Well adult exam Well adult Orders Placed This Encounter  Procedures   CMP14+EGFR   CBC with Differential/Platelet   Lipid Panel With LDL/HDL Ratio   TSH + free T4   Vitamin D  (25 hydroxy)  Screenings: per lab orders Immunizations: UTD Anticipatory guidance/Risk factor reduction:  Recommendations per AVS.    Meds ordered this encounter  Medications   albuterol  (VENTOLIN  HFA) 108 (90 Base) MCG/ACT inhaler    Sig: USE 2 INHALATIONS EVERY 6 HOURS AS NEEDED FOR WHEEZING    Dispense:  18 g    Refill:  3   budeson-glycopyrrolate-formoterol (BREZTRI  AEROSPHERE) 160-9-4.8 MCG/ACT AERO inhaler    Sig: Inhale 2 puffs into the lungs daily.    Dispense:  32.1 g    Refill:  3    No follow-ups on file.

## 2023-12-17 LAB — CMP14+EGFR
ALT: 51 IU/L — ABNORMAL HIGH (ref 0–44)
AST: 27 IU/L (ref 0–40)
Albumin: 4.4 g/dL (ref 4.1–5.1)
Alkaline Phosphatase: 75 IU/L (ref 44–121)
BUN/Creatinine Ratio: 7 — ABNORMAL LOW (ref 9–20)
BUN: 7 mg/dL (ref 6–20)
Bilirubin Total: 0.4 mg/dL (ref 0.0–1.2)
CO2: 22 mmol/L (ref 20–29)
Calcium: 9.8 mg/dL (ref 8.7–10.2)
Chloride: 102 mmol/L (ref 96–106)
Creatinine, Ser: 1.01 mg/dL (ref 0.76–1.27)
Globulin, Total: 2.2 g/dL (ref 1.5–4.5)
Glucose: 95 mg/dL (ref 70–99)
Potassium: 5.1 mmol/L (ref 3.5–5.2)
Sodium: 139 mmol/L (ref 134–144)
Total Protein: 6.6 g/dL (ref 6.0–8.5)
eGFR: 97 mL/min/{1.73_m2} (ref >59)

## 2023-12-17 LAB — TSH+FREE T4
Free T4: 1.36 ng/dL (ref 0.82–1.77)
TSH: 2.04 u[IU]/mL (ref 0.450–4.500)

## 2023-12-17 LAB — CBC WITH DIFFERENTIAL/PLATELET
Basophils Absolute: 0.1 10*3/uL (ref 0.0–0.2)
Basos: 1 %
EOS (ABSOLUTE): 0.4 10*3/uL (ref 0.0–0.4)
Eos: 7 %
Hematocrit: 49.8 % (ref 37.5–51.0)
Hemoglobin: 16 g/dL (ref 13.0–17.7)
Immature Grans (Abs): 0.1 10*3/uL (ref 0.0–0.1)
Immature Granulocytes: 1 %
Lymphocytes Absolute: 1.7 10*3/uL (ref 0.7–3.1)
Lymphs: 36 %
MCH: 29 pg (ref 26.6–33.0)
MCHC: 32.1 g/dL (ref 31.5–35.7)
MCV: 90 fL (ref 79–97)
Monocytes Absolute: 0.4 10*3/uL (ref 0.1–0.9)
Monocytes: 9 %
Neutrophils Absolute: 2.3 10*3/uL (ref 1.4–7.0)
Neutrophils: 46 %
Platelets: 337 10*3/uL (ref 150–450)
RBC: 5.52 x10E6/uL (ref 4.14–5.80)
RDW: 14.9 % (ref 11.6–15.4)
WBC: 4.9 10*3/uL (ref 3.4–10.8)

## 2023-12-17 LAB — LIPID PANEL WITH LDL/HDL RATIO
Cholesterol, Total: 244 mg/dL — ABNORMAL HIGH (ref 100–199)
HDL: 36 mg/dL — ABNORMAL LOW (ref >39)
LDL Chol Calc (NIH): 171 mg/dL — ABNORMAL HIGH (ref 0–99)
LDL/HDL Ratio: 4.8 ratio — ABNORMAL HIGH (ref 0.0–3.6)
Triglycerides: 196 mg/dL — ABNORMAL HIGH (ref 0–149)
VLDL Cholesterol Cal: 37 mg/dL (ref 5–40)

## 2023-12-17 LAB — VITAMIN D 25 HYDROXY (VIT D DEFICIENCY, FRACTURES): Vit D, 25-Hydroxy: 27.4 ng/mL — ABNORMAL LOW (ref 30.0–100.0)

## 2023-12-30 ENCOUNTER — Ambulatory Visit: Payer: Self-pay | Admitting: Family Medicine
